# Patient Record
Sex: Male | Born: 1965 | Race: Black or African American | Hispanic: No | Marital: Married | Smoking: Current some day smoker
Health system: Southern US, Community
[De-identification: ages and names within clinical notes are randomized; demographics above are authoritative.]

## PROBLEM LIST (undated history)

## (undated) DIAGNOSIS — D649 Anemia, unspecified: Secondary | ICD-10-CM

## (undated) DIAGNOSIS — Z862 Personal history of diseases of the blood and blood-forming organs and certain disorders involving the immune mechanism: Secondary | ICD-10-CM

## (undated) DIAGNOSIS — M1990 Unspecified osteoarthritis, unspecified site: Secondary | ICD-10-CM

## (undated) HISTORY — DX: Personal history of diseases of the blood and blood-forming organs and certain disorders involving the immune mechanism: Z86.2

## (undated) HISTORY — PX: VASECTOMY: SHX75

---

## 2005-03-10 ENCOUNTER — Ambulatory Visit: Payer: Self-pay | Admitting: Family Medicine

## 2009-07-13 ENCOUNTER — Emergency Department (HOSPITAL_COMMUNITY): Admission: EM | Admit: 2009-07-13 | Discharge: 2009-07-13 | Payer: Self-pay | Admitting: Family Medicine

## 2010-05-12 LAB — POCT I-STAT, CHEM 8
BUN: 12 mg/dL (ref 6–23)
Calcium, Ion: 1.12 mmol/L (ref 1.12–1.32)
Chloride: 105 mEq/L (ref 96–112)
Creatinine, Ser: 1.6 mg/dL — ABNORMAL HIGH (ref 0.4–1.5)
Glucose, Bld: 102 mg/dL — ABNORMAL HIGH (ref 70–99)
HCT: 50 % (ref 39.0–52.0)
Hemoglobin: 17 g/dL (ref 13.0–17.0)
Potassium: 4.3 mEq/L (ref 3.5–5.1)
Sodium: 142 mEq/L (ref 135–145)
TCO2: 29 mmol/L (ref 0–100)

## 2011-09-25 ENCOUNTER — Other Ambulatory Visit: Payer: Self-pay | Admitting: Orthopedic Surgery

## 2011-09-25 ENCOUNTER — Ambulatory Visit (INDEPENDENT_AMBULATORY_CARE_PROVIDER_SITE_OTHER): Payer: BC Managed Care – PPO

## 2011-09-25 DIAGNOSIS — S6990XA Unspecified injury of unspecified wrist, hand and finger(s), initial encounter: Secondary | ICD-10-CM

## 2011-09-25 DIAGNOSIS — S6980XA Other specified injuries of unspecified wrist, hand and finger(s), initial encounter: Secondary | ICD-10-CM

## 2011-09-25 DIAGNOSIS — X58XXXA Exposure to other specified factors, initial encounter: Secondary | ICD-10-CM

## 2011-10-25 HISTORY — PX: HAND SURGERY: SHX662

## 2011-12-04 ENCOUNTER — Ambulatory Visit (INDEPENDENT_AMBULATORY_CARE_PROVIDER_SITE_OTHER): Payer: BC Managed Care – PPO

## 2011-12-04 ENCOUNTER — Other Ambulatory Visit: Payer: Self-pay | Admitting: Orthopedic Surgery

## 2011-12-04 DIAGNOSIS — R52 Pain, unspecified: Secondary | ICD-10-CM

## 2011-12-04 DIAGNOSIS — Z9889 Other specified postprocedural states: Secondary | ICD-10-CM

## 2011-12-04 DIAGNOSIS — R29898 Other symptoms and signs involving the musculoskeletal system: Secondary | ICD-10-CM

## 2011-12-04 DIAGNOSIS — M7989 Other specified soft tissue disorders: Secondary | ICD-10-CM

## 2011-12-04 DIAGNOSIS — M79609 Pain in unspecified limb: Secondary | ICD-10-CM

## 2012-02-24 HISTORY — PX: ELBOW SURGERY: SHX618

## 2012-12-13 ENCOUNTER — Ambulatory Visit (INDEPENDENT_AMBULATORY_CARE_PROVIDER_SITE_OTHER): Payer: BC Managed Care – PPO | Admitting: Family Medicine

## 2012-12-13 ENCOUNTER — Encounter: Payer: Self-pay | Admitting: Family Medicine

## 2012-12-13 VITALS — BP 124/74 | HR 91 | Ht 66.0 in | Wt 187.0 lb

## 2012-12-13 DIAGNOSIS — D649 Anemia, unspecified: Secondary | ICD-10-CM

## 2012-12-13 DIAGNOSIS — R609 Edema, unspecified: Secondary | ICD-10-CM

## 2012-12-13 DIAGNOSIS — Z862 Personal history of diseases of the blood and blood-forming organs and certain disorders involving the immune mechanism: Secondary | ICD-10-CM

## 2012-12-13 HISTORY — DX: Personal history of diseases of the blood and blood-forming organs and certain disorders involving the immune mechanism: Z86.2

## 2012-12-13 LAB — COMPLETE METABOLIC PANEL WITH GFR
ALT: 30 U/L (ref 0–53)
AST: 33 U/L (ref 0–37)
Albumin: 3.4 g/dL — ABNORMAL LOW (ref 3.5–5.2)
Alkaline Phosphatase: 28 U/L — ABNORMAL LOW (ref 39–117)
BUN: 17 mg/dL (ref 6–23)
CO2: 28 mEq/L (ref 19–32)
Calcium: 8.8 mg/dL (ref 8.4–10.5)
Chloride: 107 mEq/L (ref 96–112)
Creat: 1.29 mg/dL (ref 0.50–1.35)
GFR, Est African American: 76 mL/min
GFR, Est Non African American: 66 mL/min
Glucose, Bld: 88 mg/dL (ref 70–99)
Potassium: 4.5 mEq/L (ref 3.5–5.3)
Sodium: 139 mEq/L (ref 135–145)
Total Bilirubin: 0.5 mg/dL (ref 0.3–1.2)
Total Protein: 5 g/dL — ABNORMAL LOW (ref 6.0–8.3)

## 2012-12-13 LAB — CBC
HCT: 46.2 % (ref 39.0–52.0)
Hemoglobin: 16.1 g/dL (ref 13.0–17.0)
MCH: 32.3 pg (ref 26.0–34.0)
MCHC: 34.8 g/dL (ref 30.0–36.0)
MCV: 92.6 fL (ref 78.0–100.0)
Platelets: 272 10*3/uL (ref 150–400)
RBC: 4.99 MIL/uL (ref 4.22–5.81)
RDW: 14 % (ref 11.5–15.5)
WBC: 7 10*3/uL (ref 4.0–10.5)

## 2012-12-13 NOTE — Progress Notes (Signed)
CC: [name] is a 47 y.o. male is here for Establish Care and swelling in the lower extremeties   Subjective: HPI:  Very pleasant 47 year old here to establish care  Patient complains of bilateral lower extremity edema that has been present for the last month on a daily basis. Described as pitting mild to moderate in severity. Absent in the morning worse in the evenings improves with elevation of the legs. Is symmetric has never been asymmetric. He does admit to a high sodium diet. Symptoms began well before starting a testosterone supplement. He occasionally takes creatine but has not used it in last week. He denies abdominal bloating, shortness of breath, nor edema elsewhere in the body. He reports he has a history of anemia and didn't think that he has some swelling in the extremities when this was a problem in his adolescent age. He denies cough or shortness of breath. Symptoms do not seem to be getting worse or better since onset, onset was gradual  Review of Systems - General ROS: negative for - chills, fever, night sweats, weight gain or weight loss Ophthalmic ROS: negative for - decreased vision Psychological ROS: negative for - anxiety or depression ENT ROS: negative for - hearing change, nasal congestion, tinnitus or allergies Hematological and Lymphatic ROS: negative for - bleeding problems, bruising or swollen lymph nodes Breast ROS: negative Respiratory ROS: no cough, shortness of breath, or wheezing Cardiovascular ROS: no chest pain or dyspnea on exertion Gastrointestinal ROS: no abdominal pain, change in bowel habits, or black or bloody stools Genito-Urinary ROS: negative for - genital discharge, genital ulcers, incontinence or abnormal bleeding from genitals Musculoskeletal ROS: negative for - joint pain or muscle pain Neurological ROS: negative for - headaches or memory loss Dermatological ROS: negative for lumps, mole changes, rash and skin lesion changes  Past Medical  History  Diagnosis Date  . History of anemia 12/13/2012     Family History  Problem Relation Age of Onset  . Alcoholism    . Heart attack    . Stroke    . Cancer      lung,throat,brain     History  Substance Use Topics  . Smoking status: Current Every Day Smoker    Types: Cigarettes  . Smokeless tobacco: Not on file     Comment: e-cig  . Alcohol Use: Yes     Comment: 2 x a month     Objective: Filed Vitals:   12/13/12 1405  BP: 124/74  Pulse: 91    General: Alert and Oriented, No Acute Distress HEENT: Pupils equal, round, reactive to light. Conjunctivae clear.  Moist mucous membranes pharynx unremarkable Lungs: Clear to auscultation bilaterally, no wheezing/ronchi/rales.  Comfortable work of breathing. Good air movement. Cardiac: Regular rate and rhythm. Normal S1/S2.  No murmurs, rubs, nor gallops.   Abdomen: Soft nontender Extremities: Trace-1+ pitting edema in the hindfoot extending to just above the medial malleoli bilaterally symmetrical .  Strong peripheral pulses.  Mental Status: No depression, anxiety, nor agitation. Skin: Warm and dry.  Assessment & Plan: [name] was seen today for establish care and swelling in the lower extremeties.  Diagnoses and associated orders for this visit:  Edema - B Nat Peptide - COMPLETE METABOLIC PANEL WITH GFR  Anemia - CBC  History of anemia    Edema: Ruling out congestive heart failure, hypoalbuminemia, renal insufficiency. Given history of anemia Will check hemoglobin as well. Counseled on salt restriction which should help regardless of the above etiology.  Return  if symptoms worsen or fail to improve.

## 2012-12-14 ENCOUNTER — Telehealth: Payer: Self-pay | Admitting: Family Medicine

## 2012-12-14 LAB — BRAIN NATRIURETIC PEPTIDE: Brain Natriuretic Peptide: 23.1 pg/mL (ref 0.0–100.0)

## 2012-12-14 NOTE — Telephone Encounter (Signed)
Pt wants [place] to call him at [phone] with results.

## 2012-12-14 NOTE — Telephone Encounter (Signed)
Left message on number provided that i will call pt  With results once they have been reviewed by the doctor

## 2012-12-14 NOTE — Telephone Encounter (Signed)
Patient states that he left a message this A.M. For Sue Lush and now he is calling back because he is just really concerned about his lab results. Please call patient asap. [phone]

## 2012-12-14 NOTE — Telephone Encounter (Signed)
Pt notified of result

## 2012-12-29 ENCOUNTER — Other Ambulatory Visit: Payer: Self-pay

## 2013-02-09 ENCOUNTER — Telehealth: Payer: Self-pay | Admitting: *Deleted

## 2013-02-09 DIAGNOSIS — R609 Edema, unspecified: Secondary | ICD-10-CM

## 2013-02-09 NOTE — Telephone Encounter (Signed)
Pt left message stating that you told him to contact you if he continued to have swelling & you would possibly give him a diuretic.  Please advise

## 2013-02-10 ENCOUNTER — Ambulatory Visit (INDEPENDENT_AMBULATORY_CARE_PROVIDER_SITE_OTHER): Payer: BC Managed Care – PPO | Admitting: Family Medicine

## 2013-02-10 ENCOUNTER — Encounter: Payer: Self-pay | Admitting: Family Medicine

## 2013-02-10 VITALS — BP 122/84 | HR 80 | Wt 188.0 lb

## 2013-02-10 DIAGNOSIS — R609 Edema, unspecified: Secondary | ICD-10-CM

## 2013-02-10 MED ORDER — FUROSEMIDE 20 MG PO TABS: ORAL_TABLET | ORAL | Status: DC

## 2013-02-10 NOTE — Telephone Encounter (Signed)
Pt.notified

## 2013-02-10 NOTE — Telephone Encounter (Signed)
[facility] [address], f/u visit in the next 2-3 weeks to go over effectiveness.

## 2013-02-10 NOTE — Progress Notes (Signed)
CC: [name] is a 47 y.o. male is here for f/u edema   Subjective: HPI:  Followup lower extremity edema. Patient states there has been no overall change in severity or character of his swelling since I saw him in October. He has been trying his best to limit his salt intake to less than 2000 mg a day.  He notes that the swelling is worse as the day progresses, it is bilateral, improves when wearing tightfitting boots. He was able to resolve the swelling 100% after running 3 miles and then spending some time in a sauna, he knows of nothing else that makes the symptoms better.  He thinks that it does get worse the more sugar he ingests.  He continues to work out on a daily basis strength training and cardio. Denies abdominal bloating, shortness of breath, orthopnea, edema elsewhere.    Review Of Systems Outlined In HPI  Past Medical History  Diagnosis Date  . History of anemia 12/13/2012     Family History  Problem Relation Age of Onset  . Alcoholism    . Heart attack    . Stroke    . Cancer      lung,throat,brain     History  Substance Use Topics  . Smoking status: Current Every Day Smoker    Types: Cigarettes  . Smokeless tobacco: Not on file     Comment: e-cig  . Alcohol Use: Yes     Comment: 2 x a month     Objective: Filed Vitals:   02/10/13 0956  BP: 122/84  Pulse: 80    General: Alert and Oriented, No Acute Distress HEENT: Pupils equal, round, reactive to light. Conjunctivae clear.  Moist mucous membranes pharynx unremarkable Lungs: Clear to auscultation bilaterally, no wheezing/ronchi/rales.  Comfortable work of breathing. Good air movement. Cardiac: Regular rate and rhythm. Normal S1/S2.  No murmurs, rubs, nor gallops.   Extremities: 1+ pitting edema from the malleoli proximally to the mid shins bilaterally symmetric.  Strong peripheral pulses.  Mental Status: No depression, anxiety, nor agitation. Skin: Warm and dry.  Assessment & Plan: [name] was seen  today for f/u edema.  Diagnoses and associated orders for this visit:  Edema - Microalbumin / creatinine urine ratio    Edema: Uncontrolled, Suspect venous insufficiency, his EGFR was slightly low for his age when we checked this last however he has much more than average skeletal muscle from his workout routine which I think may be contributing to his creatinine, which was high normal.  For both of our comfort we will obtain urine micro albumin creatinine ratio to see if kidney insufficiency is contributing to his edema. For now start Lasix as needed pending the above results  Return if symptoms worsen or fail to improve.

## 2013-02-11 LAB — MICROALBUMIN / CREATININE URINE RATIO
Creatinine, Urine: 128.7 mg/dL
Microalb Creat Ratio: 3.9 mg/g (ref 0.0–30.0)
Microalb, Ur: 0.5 mg/dL (ref 0.00–1.89)

## 2013-04-06 ENCOUNTER — Other Ambulatory Visit: Payer: Self-pay | Admitting: Family Medicine

## 2013-04-06 ENCOUNTER — Other Ambulatory Visit: Payer: Self-pay | Admitting: *Deleted

## 2013-04-06 MED ORDER — FUROSEMIDE 20 MG PO TABS: ORAL_TABLET | ORAL | Status: DC

## 2013-06-01 ENCOUNTER — Other Ambulatory Visit: Payer: Self-pay | Admitting: Family Medicine

## 2013-08-12 ENCOUNTER — Other Ambulatory Visit: Payer: Self-pay | Admitting: Family Medicine

## 2013-10-25 ENCOUNTER — Other Ambulatory Visit: Payer: Self-pay | Admitting: Family Medicine

## 2015-03-14 DIAGNOSIS — F411 Generalized anxiety disorder: Secondary | ICD-10-CM | POA: Diagnosis not present

## 2015-03-14 DIAGNOSIS — F331 Major depressive disorder, recurrent, moderate: Secondary | ICD-10-CM | POA: Diagnosis not present

## 2015-06-14 DIAGNOSIS — M25561 Pain in right knee: Secondary | ICD-10-CM | POA: Diagnosis not present

## 2015-06-14 DIAGNOSIS — Z87828 Personal history of other (healed) physical injury and trauma: Secondary | ICD-10-CM | POA: Diagnosis not present

## 2015-06-18 DIAGNOSIS — S46811A Strain of other muscles, fascia and tendons at shoulder and upper arm level, right arm, initial encounter: Secondary | ICD-10-CM | POA: Diagnosis not present

## 2015-06-20 DIAGNOSIS — S46811D Strain of other muscles, fascia and tendons at shoulder and upper arm level, right arm, subsequent encounter: Secondary | ICD-10-CM | POA: Diagnosis not present

## 2015-07-09 NOTE — H&P (Signed)
  [name] is an 50 y.o. male.    Chief Complaint: right shoulder pain  HPI: Pt is a 50 y.o. male complaining of right shoulder pain since recent injury. Pain had continually increased since the beginning. X-rays in the clinic proximal biceps rupture right shoulder. Pt has tried various conservative treatments which have failed to alleviate their symptoms. Various options are discussed with the patient. Risks, benefits and expectations were discussed with the patient. Patient understand the risks, benefits and expectations and wishes to proceed with surgery.   PCP:  Steve BoomHommel, Sean, DO  D/C Plans: Home  PMH: Past Medical History  Diagnosis Date  . History of anemia 12/13/2012    PSH: Past Surgical History  Procedure Laterality Date  . Hand surgery  10/2011    Social History:  reports that he has been smoking Cigarettes.  He does not have any smokeless tobacco history on file. He reports that he drinks alcohol. He reports that he does not use illicit drugs.  Allergies:  No Known Allergies  Medications: No current facility-administered medications for this encounter.   Current Outpatient Prescriptions  Medication Sig Dispense Refill  . AMBULATORY NON FORMULARY MEDICATION Xabol Testosterone booster    . furosemide (LASIX) 20 MG tablet TAKE 1 TABLET DAILY AS NEEDED FOR LOWER EXTREMITY SWELLING (PATIENT WILL NEED TO MAKE AN APPOINTMENT BEFORE MORE REFILLS) 30 tablet 0  . Multiple Vitamin (MULTIVITAMIN) tablet Take 1 tablet by mouth daily.      No results found for this or any previous visit (from the past 48 hour(s)). No results found.  ROS: Pain with rom of the right upper extremity  Physical Exam:  Alert and oriented 50 y.o. male in no acute distress Cranial nerves 2-12 intact Cervical spine: full rom with no tenderness, nv intact distally Chest: active breath sounds bilaterally, no wheeze rhonchi or rales Heart: regular rate and rhythm, no murmur Abd: non tender non  distended with active bowel sounds Hip is stable with rom  Right shoulder with obvious proximal biceps rupture with deformity nv intact distally Strength of ER and IR 4.5/5 as compared to left due to guarding No rashes or edema  Assessment/Plan Assessment: right shoulder proximal biceps rupture  Plan: Patient will undergo a right shoulder scope with open subpectoral biceps tenodesis by Dr[facility] Ranell [facility]ris [facility]. Risks benefits and expectations were discussed with the patient. Patient understand risks, benefits and expectations and wishes to proceed.

## 2015-07-09 NOTE — Pre-Procedure Instructions (Addendum)
[name]  07/09/2015      [id number] [place] [place] - [address] [place] [address] Phone: [phone] Fax: [phone]  [facility] [place] - [address] [place] [id number] Phone: [phone] Fax: [phone]  [facility] [place] [address] [place] [id number] Phone: [phone] Fax: [phone]    Your procedure is scheduled on Friday, May 19.  [facility] at 10:30 A.M.               Your surgery or procedure is scheduled for 12:45 PM   Call this number if you have problems the morning of surgery:[phone]                 For any other questions, please call [phone], Monday - Friday 8 AM - 4 PM.   Remember:  Do not eat food or drink liquids after midnight Thursday, May 18.  Take these medicines the morning of surgery with A SIP OF WATER : None.                 Stop taking Multi Viamins, do not take Aspirin, Aspirin Products, Ibuprofen (Advil), Naproxen (Aleve), or Herbal Medications.   Do not wear jewelry, make-up or nail polish.  Do not wear lotions, powders, or perfumes.     Men may shave face and neck.  Do not bring valuables to the hospital.  [facility] is not responsible for any belongings or valuables.  Contacts, dentures or bridgework may not be worn into surgery.  Leave your suitcase in the car.  After surgery it may be brought to your room.  For patients admitted to the hospital, discharge time will be determined by your treatment team.  Patients discharged the day of surgery will not be allowed to drive home.   Name and phone number of your driver:   -  Special instructions:   Special Instructions: [employer] - Preparing for Surgery  Before surgery, you can play an important role.  Because skin is not sterile, your skin needs to be as  free of germs as possible.  You can reduce the number of germs on you skin by washing with CHG (chlorahexidine gluconate) soap before surgery.  CHG is an antiseptic cleaner which kills germs and bonds with the skin to continue killing germs even after washing.  Please DO NOT use if you have an allergy to CHG or antibacterial soaps.  If your skin becomes reddened/irritated stop using the CHG and inform your nurse when you arrive at Short Stay.  Do not shave (including legs and underarms) for at least 48 hours prior to the first CHG shower.  You may shave your face.  Please follow these instructions carefully:   1.  Shower with CHG Soap the night before surgery and the morning of Surgery.  2.  If you choose to wash your hair, wash your hair first as usual with your normal shampoo.  3.  After you shampoo, rinse your hair and body thoroughly to remove the Shampoo.  4.  Use CHG as you would any other liquid soap.  You can apply chg directly  to the skin and wash gently with scrungie or a clean washcloth.  5.  Apply the CHG Soap to your body ONLY FROM THE NECK DOWN.  Do not use on open wounds or open sores.  Avoid contact with your eyes ears, mouth and genitals (private parts).  Wash genitals (private parts)       with your normal soap.  6.  Wash thoroughly, paying special attention to the area where your surgery will be performed.  7.  Thoroughly rinse your body with warm water from the neck down.  8.  DO NOT shower/wash with your normal soap after using and rinsing off the CHG Soap.  9.  Pat yourself dry with a clean towel.            10.  Wear clean pajamas.            11.  Place clean sheets on your bed the night of your first shower and do not sleep with pets.  Day of Surgery  Do not apply any lotions/deodorants the morning of surgery.  Please wear clean clothes to the hospital/surgery center.  Please read over the following fact sheets that you were given. Pain Booklet, Coughing and Deep  Breathing and Surgical Site Infection Prevention

## 2015-07-10 ENCOUNTER — Encounter (HOSPITAL_COMMUNITY)
Admission: RE | Admit: 2015-07-10 | Discharge: 2015-07-10 | Disposition: A | Discharge status: Home / Self Care | Payer: 59 | Source: Ambulatory Visit | Attending: Orthopedic Surgery | Admitting: Orthopedic Surgery

## 2015-07-10 ENCOUNTER — Encounter (HOSPITAL_COMMUNITY): Payer: Self-pay

## 2015-07-10 DIAGNOSIS — X58XXXA Exposure to other specified factors, initial encounter: Secondary | ICD-10-CM | POA: Diagnosis not present

## 2015-07-10 DIAGNOSIS — Z87891 Personal history of nicotine dependence: Secondary | ICD-10-CM | POA: Diagnosis not present

## 2015-07-10 DIAGNOSIS — F17210 Nicotine dependence, cigarettes, uncomplicated: Secondary | ICD-10-CM | POA: Diagnosis not present

## 2015-07-10 DIAGNOSIS — S43431A Superior glenoid labrum lesion of right shoulder, initial encounter: Secondary | ICD-10-CM | POA: Diagnosis not present

## 2015-07-10 DIAGNOSIS — S46211A Strain of muscle, fascia and tendon of other parts of biceps, right arm, initial encounter: Secondary | ICD-10-CM | POA: Diagnosis present

## 2015-07-10 HISTORY — DX: Anemia, unspecified: D64.9

## 2015-07-10 HISTORY — DX: Unspecified osteoarthritis, unspecified site: M19.90

## 2015-07-10 LAB — CBC
HCT: 48.7 % (ref 39.0–52.0)
Hemoglobin: 16.2 g/dL (ref 13.0–17.0)
MCH: 29.2 pg (ref 26.0–34.0)
MCHC: 33.3 g/dL (ref 30.0–36.0)
MCV: 87.7 fL (ref 78.0–100.0)
Platelets: 168 10*3/uL (ref 150–400)
RBC: 5.55 MIL/uL (ref 4.22–5.81)
RDW: 14.5 % (ref 11.5–15.5)
WBC: 5.4 10*3/uL (ref 4.0–10.5)

## 2015-07-11 MED ORDER — DEXTROSE 5 % IV SOLN
3.0000 g | INTRAVENOUS | Status: AC
  Administered 2015-07-12: 3 g via INTRAVENOUS
  Filled 2015-07-11: qty 3000

## 2015-07-12 ENCOUNTER — Ambulatory Visit (HOSPITAL_COMMUNITY): Payer: 59 | Admitting: Anesthesiology

## 2015-07-12 ENCOUNTER — Encounter (HOSPITAL_COMMUNITY): Payer: Self-pay | Admitting: *Deleted

## 2015-07-12 ENCOUNTER — Ambulatory Visit (HOSPITAL_COMMUNITY)
Admission: RE | Admit: 2015-07-12 | Discharge: 2015-07-12 | Disposition: A | Discharge status: Home / Self Care | Payer: 59 | Source: Ambulatory Visit | Attending: Orthopedic Surgery | Admitting: Orthopedic Surgery

## 2015-07-12 ENCOUNTER — Encounter (HOSPITAL_COMMUNITY)
Admission: RE | Disposition: A | Discharge status: Home / Self Care | Payer: Self-pay | Source: Ambulatory Visit | Attending: Orthopedic Surgery

## 2015-07-12 DIAGNOSIS — G8918 Other acute postprocedural pain: Secondary | ICD-10-CM | POA: Diagnosis not present

## 2015-07-12 DIAGNOSIS — Z87891 Personal history of nicotine dependence: Secondary | ICD-10-CM | POA: Diagnosis not present

## 2015-07-12 DIAGNOSIS — M66821 Spontaneous rupture of other tendons, right upper arm: Secondary | ICD-10-CM | POA: Diagnosis not present

## 2015-07-12 DIAGNOSIS — S46111D Strain of muscle, fascia and tendon of long head of biceps, right arm, subsequent encounter: Secondary | ICD-10-CM | POA: Diagnosis not present

## 2015-07-12 DIAGNOSIS — F17210 Nicotine dependence, cigarettes, uncomplicated: Secondary | ICD-10-CM | POA: Insufficient documentation

## 2015-07-12 DIAGNOSIS — S43431A Superior glenoid labrum lesion of right shoulder, initial encounter: Secondary | ICD-10-CM | POA: Insufficient documentation

## 2015-07-12 DIAGNOSIS — S46211A Strain of muscle, fascia and tendon of other parts of biceps, right arm, initial encounter: Secondary | ICD-10-CM | POA: Diagnosis not present

## 2015-07-12 DIAGNOSIS — X58XXXA Exposure to other specified factors, initial encounter: Secondary | ICD-10-CM | POA: Insufficient documentation

## 2015-07-12 HISTORY — PX: BICEPT TENODESIS: SHX5116

## 2015-07-12 HISTORY — PX: SHOULDER ARTHROSCOPY WITH SUBACROMIAL DECOMPRESSION: SHX5684

## 2015-07-12 SURGERY — SHOULDER ARTHROSCOPY WITH SUBACROMIAL DECOMPRESSION
Anesthesia: Regional | Site: Shoulder | Laterality: Right

## 2015-07-12 MED ORDER — BUPIVACAINE-EPINEPHRINE 0.25% -1:200000 IJ SOLN
INTRAMUSCULAR | Status: DC | PRN
  Administered 2015-07-12: 5 mL
  Administered 2015-07-12: 2 mL

## 2015-07-12 MED ORDER — FENTANYL CITRATE (PF) 100 MCG/2ML IJ SOLN
50.0000 ug | Freq: Once | INTRAMUSCULAR | Status: AC
  Administered 2015-07-12: 50 ug via INTRAVENOUS

## 2015-07-12 MED ORDER — BUPIVACAINE-EPINEPHRINE (PF) 0.25% -1:200000 IJ SOLN
INTRAMUSCULAR | Status: AC
Start: 2015-07-12 — End: 2015-07-12
  Filled 2015-07-12: qty 30

## 2015-07-12 MED ORDER — FENTANYL CITRATE (PF) 100 MCG/2ML IJ SOLN
INTRAMUSCULAR | Status: DC | PRN
  Administered 2015-07-12: 100 ug via INTRAVENOUS

## 2015-07-12 MED ORDER — NEOSTIGMINE METHYLSULFATE 10 MG/10ML IV SOLN
INTRAVENOUS | Status: DC | PRN
  Administered 2015-07-12: 3 mg via INTRAVENOUS

## 2015-07-12 MED ORDER — CHLORHEXIDINE GLUCONATE 4 % EX LIQD: 60.0000 mL | Freq: Once | CUTANEOUS | Status: DC

## 2015-07-12 MED ORDER — 0.9 % SODIUM CHLORIDE (POUR BTL) OPTIME
TOPICAL | Status: DC | PRN
  Administered 2015-07-12: 1000 mL

## 2015-07-12 MED ORDER — ROCURONIUM BROMIDE 100 MG/10ML IV SOLN
INTRAVENOUS | Status: DC | PRN
Start: 2015-07-12 — End: 2015-07-12
  Administered 2015-07-12: 50 mg via INTRAVENOUS

## 2015-07-12 MED ORDER — ONDANSETRON HCL 4 MG/2ML IJ SOLN
INTRAMUSCULAR | Status: DC | PRN
  Administered 2015-07-12: 4 mg via INTRAVENOUS

## 2015-07-12 MED ORDER — LIDOCAINE 2% (20 MG/ML) 5 ML SYRINGE
INTRAMUSCULAR | Status: AC
  Filled 2015-07-12: qty 10

## 2015-07-12 MED ORDER — METHOCARBAMOL 500 MG PO TABS: 500.0000 mg | ORAL_TABLET | Freq: Three times a day (TID) | ORAL | Status: DC | PRN

## 2015-07-12 MED ORDER — BUPIVACAINE-EPINEPHRINE (PF) 0.5% -1:200000 IJ SOLN
INTRAMUSCULAR | Status: DC | PRN
  Administered 2015-07-12: 20 mL via PERINEURAL

## 2015-07-12 MED ORDER — EPHEDRINE 5 MG/ML INJ
INTRAVENOUS | Status: AC
  Filled 2015-07-12: qty 10

## 2015-07-12 MED ORDER — LIDOCAINE HCL (CARDIAC) 20 MG/ML IV SOLN
INTRAVENOUS | Status: DC | PRN
  Administered 2015-07-12: 100 mg via INTRAVENOUS

## 2015-07-12 MED ORDER — MIDAZOLAM HCL 2 MG/2ML IJ SOLN
2.0000 mg | Freq: Once | INTRAMUSCULAR | Status: AC
Start: 2015-07-12 — End: 2015-07-12
  Administered 2015-07-12: 2 mg via INTRAVENOUS

## 2015-07-12 MED ORDER — EPHEDRINE SULFATE 50 MG/ML IJ SOLN
INTRAMUSCULAR | Status: DC | PRN
  Administered 2015-07-12: 7.5 mg via INTRAVENOUS

## 2015-07-12 MED ORDER — NEOSTIGMINE METHYLSULFATE 5 MG/5ML IV SOSY
PREFILLED_SYRINGE | INTRAVENOUS | Status: AC
  Filled 2015-07-12: qty 5

## 2015-07-12 MED ORDER — ONDANSETRON HCL 4 MG/2ML IJ SOLN
INTRAMUSCULAR | Status: AC
  Filled 2015-07-12: qty 2

## 2015-07-12 MED ORDER — GLYCOPYRROLATE 0.2 MG/ML IV SOSY
PREFILLED_SYRINGE | INTRAVENOUS | Status: AC
  Filled 2015-07-12: qty 3

## 2015-07-12 MED ORDER — PROPOFOL 10 MG/ML IV BOLUS
INTRAVENOUS | Status: DC | PRN
  Administered 2015-07-12: 200 mg via INTRAVENOUS

## 2015-07-12 MED ORDER — KETOROLAC TROMETHAMINE 30 MG/ML IJ SOLN
INTRAMUSCULAR | Status: AC
  Filled 2015-07-12: qty 1

## 2015-07-12 MED ORDER — FENTANYL CITRATE (PF) 100 MCG/2ML IJ SOLN: 25.0000 ug | INTRAMUSCULAR | Status: DC | PRN

## 2015-07-12 MED ORDER — FENTANYL CITRATE (PF) 250 MCG/5ML IJ SOLN
INTRAMUSCULAR | Status: AC
  Filled 2015-07-12: qty 5

## 2015-07-12 MED ORDER — PROPOFOL 10 MG/ML IV BOLUS
INTRAVENOUS | Status: AC
  Filled 2015-07-12: qty 20

## 2015-07-12 MED ORDER — FENTANYL CITRATE (PF) 100 MCG/2ML IJ SOLN
INTRAMUSCULAR | Status: AC
  Administered 2015-07-12: 50 ug via INTRAVENOUS
  Filled 2015-07-12: qty 2

## 2015-07-12 MED ORDER — KETOROLAC TROMETHAMINE 30 MG/ML IJ SOLN
30.0000 mg | Freq: Once | INTRAMUSCULAR | Status: AC
  Administered 2015-07-12: 30 mg via INTRAVENOUS
  Filled 2015-07-12: qty 1

## 2015-07-12 MED ORDER — SODIUM CHLORIDE 0.9 % IR SOLN
Status: DC | PRN
Start: 2015-07-12 — End: 2015-07-12
  Administered 2015-07-12 (×2): 3000 mL

## 2015-07-12 MED ORDER — ROCURONIUM BROMIDE 50 MG/5ML IV SOLN
INTRAVENOUS | Status: AC
  Filled 2015-07-12: qty 1

## 2015-07-12 MED ORDER — LACTATED RINGERS IV SOLN
Freq: Once | INTRAVENOUS | Status: AC
  Administered 2015-07-12: 12:00:00 via INTRAVENOUS

## 2015-07-12 MED ORDER — PROPOFOL 10 MG/ML IV BOLUS
INTRAVENOUS | Status: AC
  Filled 2015-07-12: qty 40

## 2015-07-12 MED ORDER — PHENYLEPHRINE HCL 10 MG/ML IJ SOLN
10.0000 mg | INTRAMUSCULAR | Status: DC | PRN
  Administered 2015-07-12: 10 ug/min via INTRAVENOUS

## 2015-07-12 MED ORDER — MIDAZOLAM HCL 2 MG/2ML IJ SOLN
INTRAMUSCULAR | Status: AC
  Administered 2015-07-12: 2 mg via INTRAVENOUS
  Filled 2015-07-12: qty 2

## 2015-07-12 MED ORDER — GLYCOPYRROLATE 0.2 MG/ML IJ SOLN
INTRAMUSCULAR | Status: DC | PRN
  Administered 2015-07-12: 0.6 mg via INTRAVENOUS

## 2015-07-12 MED ORDER — OXYCODONE-ACETAMINOPHEN 5-325 MG PO TABS: 1.0000 | ORAL_TABLET | ORAL | Status: DC | PRN

## 2015-07-12 MED FILL — OXYCODONE/APAP 5/325 MG TAB: 5-325 | 5 days supply | Qty: 60 | Fill #0

## 2015-07-12 SURGICAL SUPPLY — 73 items
ANCHOR ALL-SUT FLEX 1.3 Y-KNOT (Anchor) ×6 IMPLANT
BIT DRILL YKNOT HARD BONE 1.3 (BIT) ×3 IMPLANT
BLADE LONG MED 31MMX9MM (MISCELLANEOUS)
BLADE LONG MED 31X9 (MISCELLANEOUS) IMPLANT
BLADE SURG 11 STRL SS (BLADE) ×3 IMPLANT
BUR OVAL 4.0 (BURR) ×3 IMPLANT
CLOSURE WOUND 1/2 X4 (GAUZE/BANDAGES/DRESSINGS) ×1
COVER SURGICAL LIGHT HANDLE (MISCELLANEOUS) ×3 IMPLANT
DRAPE INCISE IOBAN 66X45 STRL (DRAPES) ×3 IMPLANT
DRAPE ORTHO SPLIT 77X108 STRL (DRAPES) ×2
DRAPE STERI 35X30 U-POUCH (DRAPES) ×3 IMPLANT
DRAPE SURG ORHT 6 SPLT 77X108 (DRAPES) ×1 IMPLANT
DRAPE U-SHAPE 47X51 STRL (DRAPES) ×3 IMPLANT
DRILL BIT 5/64 (BIT) ×3 IMPLANT
DRSG ADAPTIC 3X8 NADH LF (GAUZE/BANDAGES/DRESSINGS) ×3 IMPLANT
DRSG EMULSION OIL 3X3 NADH (GAUZE/BANDAGES/DRESSINGS) ×6 IMPLANT
DRSG PAD ABDOMINAL 8X10 ST (GAUZE/BANDAGES/DRESSINGS) ×6 IMPLANT
DURAPREP 26ML APPLICATOR (WOUND CARE) ×3 IMPLANT
ELECT NEEDLE TIP 2.8 STRL (NEEDLE) ×3 IMPLANT
ELECT REM PT RETURN 9FT ADLT (ELECTROSURGICAL)
ELECTRODE REM PT RTRN 9FT ADLT (ELECTROSURGICAL) IMPLANT
GAUZE SPONGE 4X4 12PLY STRL (GAUZE/BANDAGES/DRESSINGS) ×3 IMPLANT
GLOVE BIOGEL PI ORTHO PRO 7.5 (GLOVE) ×2
GLOVE BIOGEL PI ORTHO PRO SZ8 (GLOVE) ×2
GLOVE ORTHO TXT STRL SZ7.5 (GLOVE) ×3 IMPLANT
GLOVE PI ORTHO PRO STRL 7.5 (GLOVE) ×1 IMPLANT
GLOVE PI ORTHO PRO STRL SZ8 (GLOVE) ×1 IMPLANT
GLOVE SURG ORTHO 8.5 STRL (GLOVE) ×3 IMPLANT
GOWN STRL REUS W/ TWL LRG LVL3 (GOWN DISPOSABLE) ×2 IMPLANT
GOWN STRL REUS W/ TWL XL LVL3 (GOWN DISPOSABLE) ×2 IMPLANT
GOWN STRL REUS W/TWL LRG LVL3 (GOWN DISPOSABLE) ×4
GOWN STRL REUS W/TWL XL LVL3 (GOWN DISPOSABLE) ×4
KIT BASIN OR (CUSTOM PROCEDURE TRAY) ×3 IMPLANT
KIT JUGGERKNOT DISP 2.9MM (KITS) IMPLANT
KIT ROOM TURNOVER OR (KITS) ×3 IMPLANT
MANIFOLD NEPTUNE II (INSTRUMENTS) ×3 IMPLANT
NDL SUT 6 .5 CRC .975X.05 MAYO (NEEDLE) IMPLANT
NEEDLE HYPO 25GX1X1/2 BEV (NEEDLE) ×3 IMPLANT
NEEDLE MAYO TAPER (NEEDLE)
NEEDLE SPNL 18GX3.5 QUINCKE PK (NEEDLE) ×3 IMPLANT
NS IRRIG 1000ML POUR BTL (IV SOLUTION) ×3 IMPLANT
PACK SHOULDER (CUSTOM PROCEDURE TRAY) ×3 IMPLANT
PAD ARMBOARD 7.5X6 YLW CONV (MISCELLANEOUS) ×6 IMPLANT
RESECTOR FULL RADIUS 4.2MM (BLADE) ×3 IMPLANT
SET ARTHROSCOPY TUBING (MISCELLANEOUS) ×2
SET ARTHROSCOPY TUBING LN (MISCELLANEOUS) ×1 IMPLANT
SLING ARM FOAM STRAP LRG (SOFTGOODS) ×3 IMPLANT
SLING ARM LRG ADULT FOAM STRAP (SOFTGOODS) ×3 IMPLANT
SLING ARM MED ADULT FOAM STRAP (SOFTGOODS) IMPLANT
SPONGE GAUZE 4X4 12PLY STER LF (GAUZE/BANDAGES/DRESSINGS) ×3 IMPLANT
SPONGE LAP 4X18 X RAY DECT (DISPOSABLE) ×3 IMPLANT
STRIP CLOSURE SKIN 1/2X4 (GAUZE/BANDAGES/DRESSINGS) ×2 IMPLANT
SUCTION FRAZIER HANDLE 10FR (MISCELLANEOUS) ×2
SUCTION TUBE FRAZIER 10FR DISP (MISCELLANEOUS) ×1 IMPLANT
SUT BONE WAX W31G (SUTURE) IMPLANT
SUT FIBERWIRE #2 38 T-5 BLUE (SUTURE)
SUT HI-FI 2 STRAND C-2 40 (SUTURE) ×3 IMPLANT
SUT MNCRL AB 4-0 PS2 18 (SUTURE) ×3 IMPLANT
SUT VIC AB 0 CT1 27 (SUTURE)
SUT VIC AB 0 CT1 27XBRD ANBCTR (SUTURE) IMPLANT
SUT VIC AB 0 CT2 27 (SUTURE) IMPLANT
SUT VIC AB 2-0 CT1 27 (SUTURE)
SUT VIC AB 2-0 CT1 TAPERPNT 27 (SUTURE) IMPLANT
SUT VICRYL 0 CT 1 36IN (SUTURE) ×9 IMPLANT
SUTURE FIBERWR #2 38 T-5 BLUE (SUTURE) IMPLANT
SYR CONTROL 10ML LL (SYRINGE) ×3 IMPLANT
TAPE CLOTH SURG 6X10 WHT LF (GAUZE/BANDAGES/DRESSINGS) ×3 IMPLANT
TOWEL OR 17X24 6PK STRL BLUE (TOWEL DISPOSABLE) ×3 IMPLANT
TOWEL OR 17X26 10 PK STRL BLUE (TOWEL DISPOSABLE) ×3 IMPLANT
TUBE CONNECTING 12'X1/4 (SUCTIONS) ×1
TUBE CONNECTING 12X1/4 (SUCTIONS) ×2 IMPLANT
WAND HAND CNTRL MULTIVAC 90 (MISCELLANEOUS) ×3 IMPLANT
WATER STERILE IRR 1000ML POUR (IV SOLUTION) ×3 IMPLANT

## 2015-07-12 NOTE — Discharge Instructions (Signed)
Ice to the shoulder at all times  Rest the arm in the sling while up, but ok to lean to the right and slide the sling off then hug a pillow  Do exercises every hour while awake for 5 minutes  Lap slides  Gentle door hinge exercises - hug yourself then hitch hike thumb out  Pendulum exercises  Follow up in two weeks in the office  Start pain meds before the block wears off

## 2015-07-12 NOTE — Interval H&P Note (Signed)
History and Physical Interval Note:  07/12/2015 1:13 PM  [name]  has presented today for surgery, with the diagnosis of RIGHT SHOULDER LABRAL TEAR, BICEPS RUPTURE  The various methods of treatment have been discussed with the patient and family. After consideration of risks, benefits and other options for treatment, the patient has consented to  Procedure(s) with comments: RIGHT SHOULDER ARTHROSCOPY WITH LABRAL DEBRIDEMENT LIMITED SUBACROMIAL DECOMPRESSION (Right) - ANESTHESIA:  CHOICE WITH INTERSCALENE BLOCK RIGHT SUBPECTORAL TENODESIS (Right) as a surgical intervention .  The patient's history has been reviewed, patient examined, no change in status, stable for surgery.  I have reviewed the patient's chart and labs.  Questions were answered to the patient's satisfaction.     [name],STEVEN R

## 2015-07-12 NOTE — Brief Op Note (Signed)
07/12/2015  3:23 PM  PATIENT:  [name]  50 y.o. male  PRE-OPERATIVE DIAGNOSIS:  RIGHT SHOULDER LABRAL TEAR, BICEPS RUPTURE  POST-OPERATIVE DIAGNOSIS:  RIGHT SHOULDER LABRAL TEAR, BICEPS RUPTURE, PARALABRAL CYST  PROCEDURE:  Procedure(s) with comments: RIGHT SHOULDER ARTHROSCOPY WITH LABRAL DEBRIDEMENT LIMITED SUBACROMIAL DECOMPRESSION (Right) - ANESTHESIA:  CHOICE WITH INTERSCALENE BLOCK RIGHT SUBPECTORAL TENODESIS (Right) PARALABRAL CYST DECOMPRESSION  SURGEON:  Surgeon(s) and Role:    * Beverely LowSteve Sahana Boyland, MD - Primary  PHYSICIAN ASSISTANT:   ASSISTANTS: Leilani AbleSteve Chabon PA-C   ANESTHESIA:   regional and general  EBL:     BLOOD ADMINISTERED:none  DRAINS: none   LOCAL MEDICATIONS USED:  MARCAINE     SPECIMEN:  No Specimen  DISPOSITION OF SPECIMEN:  N/A  COUNTS:  YES  TOURNIQUET:  * No tourniquets in log *  DICTATION: .Other Dictation: Dictation Number [id number]  PLAN OF CARE: Discharge to home after PACU  PATIENT DISPOSITION:  PACU - hemodynamically stable.   Delay start of Pharmacological VTE agent (>24hrs) due to surgical blood loss or risk of bleeding: not applicable

## 2015-07-12 NOTE — Transfer of Care (Signed)
Immediate Anesthesia Transfer of Care Note  Patient: [name]  Procedure(s) Performed: Procedure(s) with comments: RIGHT SHOULDER ARTHROSCOPY WITH LABRAL DEBRIDEMENT LIMITED SUBACROMIAL DECOMPRESSION (Right) - ANESTHESIA:  CHOICE WITH INTERSCALENE BLOCK RIGHT SUBPECTORAL TENODESIS (Right)  Patient Location: PACU  Anesthesia Type:GA combined with regional for post-op pain  Level of Consciousness: awake, alert  and oriented  Airway & Oxygen Therapy: Patient Spontanous Breathing and Patient connected to nasal cannula oxygen  Post-op Assessment: Report given to RN, Post -op Vital signs reviewed and stable and Patient moving all extremities  Post vital signs: Reviewed and stable  Last Vitals:  Filed Vitals:   07/12/15 1230 07/12/15 1235  BP: 140/70 145/77  Pulse: 84 82  Temp:    Resp: 21 23    Last Pain: There were no vitals filed for this visit.       Complications: No apparent anesthesia complications

## 2015-07-12 NOTE — Anesthesia Procedure Notes (Addendum)
Anesthesia Regional Block:  Interscalene brachial plexus block  Pre-Anesthetic Checklist: ,, timeout performed, Correct Patient, Correct Site, Correct Laterality, Correct Procedure, Correct Position, site marked, Risks and benefits discussed,  Surgical consent,  Pre-op evaluation,  At surgeon's request and post-op pain management  Laterality: Right  Prep: chloraprep       Needles:  Injection technique: Single-shot  Needle Type: Echogenic Stimulator Needle     Needle Length: 9cm 9 cm Needle Gauge: 22 and 22 G    Additional Needles:  Procedures: ultrasound guided (picture in chart) Interscalene brachial plexus block Narrative:  Injection made incrementally with aspirations every 5 mL.  Performed by: Personally  Anesthesiologist: Cecile HearingURK, STEPHEN EDWARD  Additional Notes: Functioning IV was confirmed and monitors were applied.  A 90mm 22ga Arrow echogenic stimulator needle was used. Sterile prep and drape, hand hygiene and sterile gloves were used.  Negative aspiration and negative test dose prior to incremental administration of local anesthetic. The patient tolerated the procedure well.  Ultrasound guidance: relevent anatomy identified, needle position confirmed, local anesthetic spread visualized around nerve(s), vascular puncture avoided.  Image printed for medical record.    Procedure Name: Intubation Date/Time: 07/12/2015 1:46 PM Performed by: Sharlene DoryWALKER, Darcell Sabino E Pre-anesthesia Checklist: Patient identified, Emergency Drugs available, Suction available, Patient being monitored and Timeout performed Patient Re-evaluated:Patient Re-evaluated prior to inductionOxygen Delivery Method: Circle system utilized Preoxygenation: Pre-oxygenation with 100% oxygen Intubation Type: IV induction Ventilation: Mask ventilation without difficulty Laryngoscope Size: Mac and 4 Grade View: Grade II Tube type: Oral Tube size: 7.5 mm Number of attempts: 1 Airway Equipment and Method:  Stylet Placement Confirmation: ETT inserted through vocal cords under direct vision,  positive ETCO2 and breath sounds checked- equal and bilateral Secured at: 22 cm Tube secured with: Tape Dental Injury: Teeth and Oropharynx as per pre-operative assessment

## 2015-07-12 NOTE — Anesthesia Preprocedure Evaluation (Addendum)
Anesthesia Evaluation  Patient identified by MRN, date of birth, ID band Patient awake    Reviewed: Allergy & Precautions, NPO status , Patient's Chart, lab work & pertinent test results  Airway Mallampati: II  TM Distance: >3 FB Neck ROM: Full    Dental  (+) Teeth Intact, Dental Advisory Given, Chipped,    Pulmonary former smoker,    Pulmonary exam normal breath sounds clear to auscultation       Cardiovascular negative cardio ROS Normal cardiovascular exam Rhythm:Regular Rate:Normal     Neuro/Psych negative neurological ROS  negative psych ROS   GI/Hepatic negative GI ROS, Neg liver ROS,   Endo/Other  negative endocrine ROS  Renal/GU negative Renal ROS     Musculoskeletal  (+) Arthritis , Osteoarthritis,    Abdominal   Peds  Hematology negative hematology ROS (+)   Anesthesia Other Findings Day of surgery medications reviewed with the patient.  Reproductive/Obstetrics                          Anesthesia Physical Anesthesia Plan  ASA: I  Anesthesia Plan: General and Regional   Post-op Pain Management:  Regional for Post-op pain   Induction: Intravenous  Airway Management Planned: Oral ETT  Additional Equipment:   Intra-op Plan:   Post-operative Plan: Extubation in OR  Informed Consent: I have reviewed the patients History and Physical, chart, labs and discussed the procedure including the risks, benefits and alternatives for the proposed anesthesia with the patient or authorized representative who has indicated his/her understanding and acceptance.   Dental advisory given  Plan Discussed with: CRNA  Anesthesia Plan Comments: (Risks/benefits of general anesthesia discussed with patient including risk of damage to teeth, lips, gum, and tongue, nausea/vomiting, allergic reactions to medications, and the possibility of heart attack, stroke and death.  All patient questions  answered.  Patient wishes to proceed.  Discussed risks and benefits of interscalene block including failure, bleeding, infection, nerve damage, weakness, shortness of breath, pneumothorax. Questions answered. Patient consents to block. )       Anesthesia Quick Evaluation

## 2015-07-12 NOTE — Anesthesia Postprocedure Evaluation (Signed)
Anesthesia Post Note  Patient: [name]  Procedure(s) Performed: Procedure(s) (LRB): RIGHT SHOULDER ARTHROSCOPY WITH LABRAL DEBRIDEMENT LIMITED SUBACROMIAL DECOMPRESSION (Right) RIGHT SUBPECTORAL TENODESIS (Right)  Patient location during evaluation: PACU Anesthesia Type: General and Regional Level of consciousness: awake and alert Pain management: pain level controlled Vital Signs Assessment: post-procedure vital signs reviewed and stable Respiratory status: spontaneous breathing, nonlabored ventilation, respiratory function stable and patient connected to nasal cannula oxygen Cardiovascular status: blood pressure returned to baseline and stable Postop Assessment: no signs of nausea or vomiting Anesthetic complications: no    Last Vitals:  Filed Vitals:   07/12/15 1557 07/12/15 1611  BP: 110/57 111/73  Pulse: 83 76  Temp: 36.6 C 36.7 C  Resp: 17 17    Last Pain: There were no vitals filed for this visit.               Kennieth RadFitzgerald, [name]

## 2015-07-13 NOTE — Op Note (Signed)
NAMWesley [name]:  [name], [name]                 ACCOUNT NO.:  [id number]  MEDICAL RECORD NO.:  [id number]  LOCATION:  [facility]                         FACILITY:  [facility]  PHYSICIAN:  Almedia BallsSteven R. Ranell PatrickNorris, M.D. DATE OF BIRTH:  1965-11-05  DATE OF PROCEDURE:  07/12/2015 DATE OF DISCHARGE:  07/12/2015                              OPERATIVE REPORT   PREOPERATIVE DIAGNOSIS:  Right proximal biceps rupture and superior labrum, anterior and posterior tear.  POSTOPERATIVE DIAGNOSIS:  Right proximal biceps rupture and superior labrum, anterior and posterior tear as well as inferior labral tear and paralabral cyst.  PROCEDURE PERFORMED:  Right shoulder arthroscopy with debridement of superior labrum, anterior and posterior tear and biceps stump as well as debridement of inferior labral tear and decompression of paralabral cyst, subacromial bursectomy.  No acromioplasty was performed followed by open subpectoral tenodesis.  ATTENDING SURGEON:  Almedia BallsSteven R. Ranell PatrickNorris, M.D.  ASSISTANT:  Steve BitterStephen J. Chabon, PA-C.  ANESTHESIA:  General anesthesia was used plus an interscalene block.  ESTIMATED BLOOD LOSS:  Minimal.  FLUID REPLACEMENT:  1200 mL crystalloid.  COUNTS:  Instrument counts were correct.  COMPLICATIONS:  There were no complications.  ANTIBIOTICS:  Perioperative antibiotics were given.  INDICATIONS:  The patient is a 50 year old male with history of an injury to the right arm, which resulted in a pop and immediate deformity of the arm, consistent with long head biceps rupture.  The patient complained of immediate pain and deformity, presented to Orthopedics, where we suspected a long head biceps rupture.  MRI scan confirming a stump of the biceps in the shoulder, no evidence of rotator cuff tear. There was an inferior labral tear and paralabral cyst and then the patient did have nonvisualized biceps tear consistent with rupture.  We discussed options for management with the patient, he was  having cramping in the arm and he elected to proceed given his active lifestyle and he is desire to return to weightlifting, desired shoulder arthroscopy with debridement of the labral tears as well as subpectoral biceps tenodesis.  Risks and benefits of the surgery were discussed. Informed consent obtained.  DESCRIPTION OF PROCEDURE:  After an adequate level of anesthesia was achieved, the patient was positioned in the modified beach-chair position.  Right shoulder was correctly identified and sterilely prepped and draped in usual sterile manner.  Time-out called.  We entered the shoulder using standard arthroscopic portals including anterior and posterolateral portals.  I identified significant tearing of the biceps at the superior labral junction.  We removed that biceps stump with the basket forceps and motorized shaver.  Subscapularis normal, rotator cuff normal as visualized from the anterior-posterior portals.  Articular cartilage normal.  Anterior inferior labrum torn.  We went ahead and used a Therapist, nutritionalreer elevator to decompress the paralabral cyst, yellowish fluid came out of that area, so we had a nice decompression using basket forceps and motorized shaver to debride that inferior labral tear back to stable labral rim.  The remaining labral tissue was felt to be stable.  We then placed a scope in the subacromial space.  Bursectomy was performed.  No acromioplasty, CA ligament preserved, rotator cuff visualized and palpated and felt to be normal.  We concluded the arthroscopic portion of the procedure.  We then went ahead and made a longitudinal incision over the palpable long head biceps tendon distally, this was below the pectoralis insertion on the humerus.  I was able to identify once we went down through the subcutaneous tissues with scissors, identified the biceps tendon, was able to free up the biceps muscle from surrounding tissue or starting to scar together.  We  pulled everything on tension, so that the long head of the biceps and short head of the biceps were in similar tension.  We whipstitched the long head biceps with #2 Hi-Fi suture in a baseball stitch.  We then identified the distal portion of the biceps groove underneath the pectoralis.  We used the needle-tip Bovie to remove the periosteum and then used a Therapist, nutritional to scrape the bone to encourage healing.  We then placed two Y-Knot Flex anchors through the floor of the biceps groove and brought those sutures up through the reinforced biceps tendon in a whipstitch fashion.  One distal, one more proximal.  We then took the suture into the biceps and then brought that through the pec insertion in a Pulvertaft weave, so up and through and then lateral and then back down on itself and sutured to itself.  We also tied the suture anchor sutures down the bones, so we had firm apposition of the bone and also 180-degree bend around the soft tissue and then reinforced by sewing through the pec tendon, thus we had a very strong reinforced subpectoral tenodesis.  We irrigated it thoroughly and then closed the subcu layer with 2-0 Vicryl followed by 4-0 Monocryl for skin and portals.  Steri-Strips applied followed by sterile dressing.  The patient tolerated the surgery well.     Almedia Balls. Ranell Patrick, M.D.     SRN/MEDQ  D:  07/12/2015  T:  07/13/2015  Job:  [id number]

## 2015-07-16 ENCOUNTER — Encounter (HOSPITAL_COMMUNITY): Payer: Self-pay | Admitting: Orthopedic Surgery

## 2015-08-09 DIAGNOSIS — S83241A Other tear of medial meniscus, current injury, right knee, initial encounter: Secondary | ICD-10-CM | POA: Diagnosis not present

## 2015-08-09 DIAGNOSIS — Y999 Unspecified external cause status: Secondary | ICD-10-CM | POA: Diagnosis not present

## 2015-08-09 DIAGNOSIS — M25561 Pain in right knee: Secondary | ICD-10-CM | POA: Diagnosis not present

## 2015-08-20 DIAGNOSIS — Z0189 Encounter for other specified special examinations: Secondary | ICD-10-CM | POA: Diagnosis not present

## 2015-08-20 DIAGNOSIS — M25561 Pain in right knee: Secondary | ICD-10-CM | POA: Diagnosis not present

## 2015-08-20 DIAGNOSIS — Z7189 Other specified counseling: Secondary | ICD-10-CM | POA: Diagnosis not present

## 2015-09-09 DIAGNOSIS — F4001 Agoraphobia with panic disorder: Secondary | ICD-10-CM | POA: Diagnosis not present

## 2015-09-09 DIAGNOSIS — F331 Major depressive disorder, recurrent, moderate: Secondary | ICD-10-CM | POA: Diagnosis not present

## 2015-09-09 DIAGNOSIS — F411 Generalized anxiety disorder: Secondary | ICD-10-CM | POA: Diagnosis not present

## 2015-09-10 DIAGNOSIS — S83241A Other tear of medial meniscus, current injury, right knee, initial encounter: Secondary | ICD-10-CM | POA: Diagnosis not present

## 2015-09-10 DIAGNOSIS — M6751 Plica syndrome, right knee: Secondary | ICD-10-CM | POA: Diagnosis not present

## 2015-09-10 DIAGNOSIS — X58XXXA Exposure to other specified factors, initial encounter: Secondary | ICD-10-CM | POA: Diagnosis not present

## 2015-09-24 DIAGNOSIS — Z4789 Encounter for other orthopedic aftercare: Secondary | ICD-10-CM | POA: Diagnosis not present

## 2015-09-24 DIAGNOSIS — Z9889 Other specified postprocedural states: Secondary | ICD-10-CM | POA: Diagnosis not present

## 2016-04-14 ENCOUNTER — Emergency Department (HOSPITAL_BASED_OUTPATIENT_CLINIC_OR_DEPARTMENT_OTHER)
Admission: EM | Admit: 2016-04-14 | Discharge: 2016-04-14 | Disposition: A | Discharge status: Home / Self Care | Payer: 59 | Attending: Emergency Medicine | Admitting: Emergency Medicine

## 2016-04-14 ENCOUNTER — Emergency Department: Payer: 59

## 2016-04-14 ENCOUNTER — Emergency Department (HOSPITAL_BASED_OUTPATIENT_CLINIC_OR_DEPARTMENT_OTHER): Payer: 59

## 2016-04-14 ENCOUNTER — Encounter (HOSPITAL_BASED_OUTPATIENT_CLINIC_OR_DEPARTMENT_OTHER): Payer: Self-pay | Admitting: Emergency Medicine

## 2016-04-14 DIAGNOSIS — Z87891 Personal history of nicotine dependence: Secondary | ICD-10-CM | POA: Diagnosis not present

## 2016-04-14 DIAGNOSIS — S0181XA Laceration without foreign body of other part of head, initial encounter: Secondary | ICD-10-CM | POA: Diagnosis not present

## 2016-04-14 DIAGNOSIS — T887XXA Unspecified adverse effect of drug or medicament, initial encounter: Secondary | ICD-10-CM | POA: Insufficient documentation

## 2016-04-14 DIAGNOSIS — Y939 Activity, unspecified: Secondary | ICD-10-CM | POA: Insufficient documentation

## 2016-04-14 DIAGNOSIS — R55 Syncope and collapse: Secondary | ICD-10-CM | POA: Insufficient documentation

## 2016-04-14 DIAGNOSIS — S0990XA Unspecified injury of head, initial encounter: Secondary | ICD-10-CM | POA: Diagnosis present

## 2016-04-14 DIAGNOSIS — T50905A Adverse effect of unspecified drugs, medicaments and biological substances, initial encounter: Secondary | ICD-10-CM | POA: Diagnosis not present

## 2016-04-14 DIAGNOSIS — T50995A Adverse effect of other drugs, medicaments and biological substances, initial encounter: Secondary | ICD-10-CM | POA: Diagnosis not present

## 2016-04-14 DIAGNOSIS — W19XXXA Unspecified fall, initial encounter: Secondary | ICD-10-CM | POA: Diagnosis not present

## 2016-04-14 DIAGNOSIS — Y999 Unspecified external cause status: Secondary | ICD-10-CM | POA: Diagnosis not present

## 2016-04-14 DIAGNOSIS — Y929 Unspecified place or not applicable: Secondary | ICD-10-CM | POA: Insufficient documentation

## 2016-04-14 DIAGNOSIS — Y829 Unspecified medical devices associated with adverse incidents: Secondary | ICD-10-CM | POA: Insufficient documentation

## 2016-04-14 DIAGNOSIS — G4733 Obstructive sleep apnea (adult) (pediatric): Secondary | ICD-10-CM | POA: Diagnosis not present

## 2016-04-14 LAB — CBC WITH DIFFERENTIAL/PLATELET
Basophils Absolute: 0 10*3/uL (ref 0.0–0.1)
Basophils Relative: 0 %
Eosinophils Absolute: 0.2 10*3/uL (ref 0.0–0.7)
Eosinophils Relative: 4 %
HCT: 43.3 % (ref 39.0–52.0)
Hemoglobin: 14.2 g/dL (ref 13.0–17.0)
Lymphocytes Relative: 22 %
Lymphs Abs: 1.4 10*3/uL (ref 0.7–4.0)
MCH: 28.1 pg (ref 26.0–34.0)
MCHC: 32.8 g/dL (ref 30.0–36.0)
MCV: 85.6 fL (ref 78.0–100.0)
Monocytes Absolute: 0.4 10*3/uL (ref 0.1–1.0)
Monocytes Relative: 7 %
Neutro Abs: 4.4 10*3/uL (ref 1.7–7.7)
Neutrophils Relative %: 67 %
Platelets: 180 10*3/uL (ref 150–400)
RBC: 5.06 MIL/uL (ref 4.22–5.81)
RDW: 14.5 % (ref 11.5–15.5)
WBC: 6.5 10*3/uL (ref 4.0–10.5)

## 2016-04-14 LAB — URINALYSIS, ROUTINE W REFLEX MICROSCOPIC
Bilirubin Urine: NEGATIVE
Glucose, UA: NEGATIVE mg/dL
Hgb urine dipstick: NEGATIVE
Ketones, ur: NEGATIVE mg/dL
Leukocytes, UA: NEGATIVE
Nitrite: NEGATIVE
Protein, ur: NEGATIVE mg/dL
Specific Gravity, Urine: 1.01 (ref 1.005–1.030)
pH: 6 (ref 5.0–8.0)

## 2016-04-14 LAB — BASIC METABOLIC PANEL
Anion gap: 5 (ref 5–15)
BUN: 14 mg/dL (ref 6–20)
CO2: 30 mmol/L (ref 22–32)
Calcium: 9 mg/dL (ref 8.9–10.3)
Chloride: 102 mmol/L (ref 101–111)
Creatinine, Ser: 1.48 mg/dL — ABNORMAL HIGH (ref 0.61–1.24)
GFR calc Af Amer: >60 mL/min (ref >60)
GFR calc non Af Amer: 54 mL/min — ABNORMAL LOW (ref >60)
Glucose, Bld: 99 mg/dL (ref 65–99)
Potassium: 3.8 mmol/L (ref 3.5–5.1)
Sodium: 137 mmol/L (ref 135–145)

## 2016-04-14 LAB — TROPONIN I: Troponin I: <0.03 ng/mL (ref <0.03)

## 2016-04-14 MED ORDER — LIDOCAINE HCL 1 % IJ SOLN
15.0000 mL | Freq: Once | INTRAMUSCULAR | Status: AC
  Administered 2016-04-14: 15 mL
  Filled 2016-04-14: qty 20

## 2016-04-14 MED ORDER — SODIUM CHLORIDE 0.9 % IV BOLUS (SEPSIS)
1000.0000 mL | Freq: Once | INTRAVENOUS | Status: AC
  Administered 2016-04-14: 1000 mL via INTRAVENOUS

## 2016-04-14 MED ORDER — LIDOCAINE-EPINEPHRINE-TETRACAINE (LET) SOLUTION
3.0000 mL | Freq: Once | NASAL | Status: AC
  Administered 2016-04-14: 3 mL via TOPICAL
  Filled 2016-04-14: qty 3

## 2016-04-14 NOTE — ED Notes (Signed)
Here for "felt light headed when I got up to go the b/r, may have stood up to quickly, got dizzy, fell and hit my chin", admits to syncope, denies feeling bad prior to event. Alert, NAD, calm, interactive, resps e/u, speaking in clear complete sentences, no dyspnea noted, skin W&D, VSS, (denies: sob, nvd, fever, cough congestion cold sx, dizziness, ringing in ears, loose teeth, malocclusion, other injuries or pain, head, neck or back pain or visual changes). [facility]

## 2016-04-14 NOTE — ED Notes (Signed)
Back from CT, alert, NAD, calm, interactive, resps e/u, no dyspnea noted.

## 2016-04-14 NOTE — ED Provider Notes (Addendum)
[facility] Note   CSN: [id number] Arrival date & time: 04/14/16  0228     History   Chief Complaint Chief Complaint  Patient presents with  . Loss of Consciousness  . Laceration    HPI [name] is a 51 y.o. male.  The history is provided by the patient.  Loss of Consciousness   This is a new problem. The current episode started 3 to 5 hours ago. The problem occurs constantly. The problem has been resolved. He lost consciousness for a period of less than one minute. The problem is associated with standing up (on 2 new medications that can cause this trazedone). Associated symptoms include light-headedness. Pertinent negatives include abdominal pain, back pain, bladder incontinence, bowel incontinence, chest pain, clumsiness, confusion, congestion, diaphoresis, dizziness, fever, focal sensory loss, focal weakness, headaches, malaise/fatigue, nausea, palpitations, seizures, slurred speech, vertigo, visual change, vomiting and weakness. He has tried nothing for the symptoms. The treatment provided significant relief. His past medical history does not include CAD or CVA. Past medical history comments: started 2 new meds for sleep and night mares both of which can do this took them both this evening.  Denies chest pain, shortness of breath, DOE, neck pain, pain between the shoulder blades, diaphoresis.  No focal neuro symptoms.  Started trazodone and another medication for nightmares known to affect BP was told to be aware this could happen.    Past Medical History:  Diagnosis Date  . Anemia    child  . Arthritis   . History of anemia 12/13/2012    Patient Active Problem List   Diagnosis Date Noted  . History of anemia 12/13/2012    Past Surgical History:  Procedure Laterality Date  . BICEPT TENODESIS Right 07/12/2015   Procedure: RIGHT SUBPECTORAL TENODESIS;  Surgeon: Beverely Low, MD;  Location: [facility] OR;  Service: Orthopedics;  Laterality: Right;  . ELBOW  SURGERY Right 2014   tendon  . HAND SURGERY Right 10/2011   tendon rupture  . SHOULDER ARTHROSCOPY WITH SUBACROMIAL DECOMPRESSION Right 07/12/2015   Procedure: RIGHT SHOULDER ARTHROSCOPY WITH LABRAL DEBRIDEMENT LIMITED SUBACROMIAL DECOMPRESSION;  Surgeon: Beverely Low, MD;  Location: [facility] OR;  Service: Orthopedics;  Laterality: Right;  ANESTHESIA:  CHOICE WITH INTERSCALENE BLOCK       Home Medications    Prior to Admission medications   Medication Sig Start Date End Date Taking? Authorizing Provider  methocarbamol (ROBAXIN) 500 MG tablet Take 1 tablet (500 mg total) by mouth 3 (three) times daily as needed. 07/12/15   Beverely Low, MD  Multiple Vitamin (MULTIVITAMIN) tablet Take 1 tablet by mouth daily.    Historical Provider, MD  oxyCODONE-acetaminophen (ROXICET) 5-325 MG tablet Take 1-2 tablets by mouth every 4 (four) hours as needed for severe pain. 07/12/15   Beverely Low, MD  tetrahydrozoline-zinc (VISINE-AC) 0.05-0.25 % ophthalmic solution Place 1 drop into both eyes daily as needed (for dry eyes).     Historical Provider, MD    Family History Family History  Problem Relation Age of Onset  . Alcoholism    . Heart attack    . Stroke    . Cancer      lung,throat,brain    Social History Social History  Substance Use Topics  . Smoking status: Former Smoker    Types: Cigarettes    Quit date: 07/10/2006  . Smokeless tobacco: Never Used     Comment: e-cig  . Alcohol use 1.2 oz/week    2 Cans of beer per  week     Allergies   Patient has no known allergies.   Review of Systems Review of Systems  Constitutional: Negative for chills, diaphoresis, fever and malaise/fatigue.  HENT: Negative for congestion.   Respiratory: Negative for chest tightness and shortness of breath.   Cardiovascular: Positive for syncope. Negative for chest pain, palpitations and leg swelling.  Gastrointestinal: Negative for abdominal pain, bowel incontinence, nausea and vomiting.  Genitourinary:  Negative for bladder incontinence and dysuria.  Musculoskeletal: Negative for back pain and neck pain.  Skin: Positive for wound.  Neurological: Positive for syncope and light-headedness. Negative for dizziness, vertigo, tremors, focal weakness, seizures, facial asymmetry, speech difficulty, weakness, numbness and headaches.  Psychiatric/Behavioral: Negative for confusion.  All other systems reviewed and are negative.    Physical Exam Updated Vital Signs BP 124/72 (BP Location: Left Arm)   Pulse 61   Temp 97.7 F (36.5 C) (Oral)   Resp 16   Wt 180 lb (81.6 kg)   SpO2 100%   BMI 27.37 kg/m   Physical Exam  Constitutional: He is oriented to person, place, and time. He appears well-developed and well-nourished. No distress.  HENT:  Head: Normocephalic. Head is without raccoon's eyes and without Battle's sign.    Right Ear: External ear normal. No hemotympanum.  Left Ear: External ear normal. No hemotympanum.  Mouth/Throat: No oropharyngeal exudate.  Eyes: Conjunctivae and EOM are normal. Pupils are equal, round, and reactive to light.  Neck: Normal range of motion. Neck supple. No JVD present.  Cardiovascular: Normal rate, regular rhythm and intact distal pulses.   Pulmonary/Chest: Effort normal and breath sounds normal. No stridor. He has no wheezes. He has no rales.  Abdominal: Soft. Bowel sounds are normal. He exhibits no mass. There is no tenderness. There is no rebound and no guarding.  Musculoskeletal: Normal range of motion. He exhibits no edema, tenderness or deformity.       Cervical back: Normal.       Thoracic back: Normal.  Lymphadenopathy:    He has no cervical adenopathy.  Neurological: He is alert and oriented to person, place, and time. He displays normal reflexes. No cranial nerve deficit or sensory deficit. He exhibits normal muscle tone.  Skin: Skin is warm and dry. Capillary refill takes less than 2 seconds. He is not diaphoretic.  Psychiatric: He has a  normal mood and affect.     ED Treatments / Results   Vitals:   04/14/16 0332 04/14/16 0432  BP: 104/69 127/72  Pulse: 75 73  Resp: 14 13  Temp:     Results for orders placed or performed during the hospital encounter of 04/14/16  CBC with Differential/Platelet  Result Value Ref Range   WBC 6.5 4.0 - 10.5 K/uL   RBC 5.06 4.22 - 5.81 MIL/uL   Hemoglobin 14.2 13.0 - 17.0 g/dL   HCT 21.343.3 08.639.0 - 57.852.0 %   MCV 85.6 78.0 - 100.0 fL   MCH 28.1 26.0 - 34.0 pg   MCHC 32.8 30.0 - 36.0 g/dL   RDW 46.914.5 62.911.5 - 52.815.5 %   Platelets 180 150 - 400 K/uL   Neutrophils Relative % 67 %   Neutro Abs 4.4 1.7 - 7.7 K/uL   Lymphocytes Relative 22 %   Lymphs Abs 1.4 0.7 - 4.0 K/uL   Monocytes Relative 7 %   Monocytes Absolute 0.4 0.1 - 1.0 K/uL   Eosinophils Relative 4 %   Eosinophils Absolute 0.2 0.0 - 0.7 K/uL   Basophils  Relative 0 %   Basophils Absolute 0.0 0.0 - 0.1 K/uL  Basic metabolic panel  Result Value Ref Range   Sodium 137 135 - 145 mmol/L   Potassium 3.8 3.5 - 5.1 mmol/L   Chloride 102 101 - 111 mmol/L   CO2 30 22 - 32 mmol/L   Glucose, Bld 99 65 - 99 mg/dL   BUN 14 6 - 20 mg/dL   Creatinine, Ser 6.04 (H) 0.61 - 1.24 mg/dL   Calcium 9.0 8.9 - 54.0 mg/dL   GFR calc non Af Amer 54 (L) >60 mL/min   GFR calc Af Amer >60 >60 mL/min   Anion gap 5 5 - 15  Troponin I  Result Value Ref Range   Troponin I <0.03 <0.03 ng/mL  Urinalysis, Routine w reflex microscopic  Result Value Ref Range   Color, Urine YELLOW YELLOW   APPearance CLEAR CLEAR   Specific Gravity, Urine 1.010 1.005 - 1.030   pH 6.0 5.0 - 8.0   Glucose, UA NEGATIVE NEGATIVE mg/dL   Hgb urine dipstick NEGATIVE NEGATIVE   Bilirubin Urine NEGATIVE NEGATIVE   Ketones, ur NEGATIVE NEGATIVE mg/dL   Protein, ur NEGATIVE NEGATIVE mg/dL   Nitrite NEGATIVE NEGATIVE   Leukocytes, UA NEGATIVE NEGATIVE   Dg Chest 2 View  Result Date: 04/14/2016 CLINICAL DATA:  Syncopal episode EXAM: CHEST  2 VIEW COMPARISON:  None. FINDINGS:  The heart size and mediastinal contours are within normal limits. Both lungs are clear. The visualized skeletal structures are unremarkable. IMPRESSION: No active cardiopulmonary disease. Electronically Signed   By: Jasmine Pang M.D.   On: 04/14/2016 03:43   Ct Head Wo Contrast  Result Date: 04/14/2016 CLINICAL DATA:  Syncopal episode EXAM: CT HEAD WITHOUT CONTRAST TECHNIQUE: Contiguous axial images were obtained from the base of the skull through the vertex without intravenous contrast. COMPARISON:  None. FINDINGS: Brain: No evidence of acute infarction, hemorrhage, hydrocephalus, extra-axial collection or mass lesion/mass effect. Vascular: No hyperdense vessel or unexpected calcification. Skull: Normal. Negative for fracture or focal lesion. Sinuses/Orbits: Mucosal thickening within the ethmoid sinuses. No acute orbital abnormality. Other: None IMPRESSION: No CT evidence for acute intracranial abnormality. Electronically Signed   By: Jasmine Pang M.D.   On: 04/14/2016 03:24    EKG  EKG Interpretation  Date/Time:  Tuesday April 14 2016 02:51:23 EST Ventricular Rate:  64 PR Interval:    QRS Duration: 102 QT Interval:  406 QTC Calculation: 419 R Axis:   71 Text Interpretation:  Sinus rhythm motion artifact Confirmed by [facility]  MD, Kojo Liby ([id number]) on 04/14/2016 3:00:10 AM       Radiology Dg Chest 2 View  Result Date: 04/14/2016 CLINICAL DATA:  Syncopal episode EXAM: CHEST  2 VIEW COMPARISON:  None. FINDINGS: The heart size and mediastinal contours are within normal limits. Both lungs are clear. The visualized skeletal structures are unremarkable. IMPRESSION: No active cardiopulmonary disease. Electronically Signed   By: Jasmine Pang M.D.   On: 04/14/2016 03:43   Ct Head Wo Contrast  Result Date: 04/14/2016 CLINICAL DATA:  Syncopal episode EXAM: CT HEAD WITHOUT CONTRAST TECHNIQUE: Contiguous axial images were obtained from the base of the skull through the vertex without  intravenous contrast. COMPARISON:  None. FINDINGS: Brain: No evidence of acute infarction, hemorrhage, hydrocephalus, extra-axial collection or mass lesion/mass effect. Vascular: No hyperdense vessel or unexpected calcification. Skull: Normal. Negative for fracture or focal lesion. Sinuses/Orbits: Mucosal thickening within the ethmoid sinuses. No acute orbital abnormality. Other: None IMPRESSION: No CT evidence for  acute intracranial abnormality. Electronically Signed   By: Jasmine Pang M.D.   On: 04/14/2016 03:24    Procedures .Marland KitchenLaceration Repair Date/Time: 04/14/2016 5:38 AM Performed by: Cy Blamer Authorized by: Cy Blamer   Consent:    Consent obtained:  Verbal   Consent given by:  Patient   Risks discussed:  Infection and need for additional repair   Alternatives discussed:  No treatment Anesthesia (see MAR for exact dosages):    Anesthesia method:  Topical application and local infiltration   Topical anesthetic:  LET   Local anesthetic:  Lidocaine 1% w/o epi Laceration details:    Location:  Face (chin)   Length (cm):  1.5   Depth (mm):  2 Repair type:    Repair type:  Intermediate Pre-procedure details:    Preparation:  Patient was prepped and draped in usual sterile fashion Exploration:    Hemostasis achieved with:  LET   Wound exploration: wound explored through full range of motion     Wound extent: no areolar tissue violation noted  Foreign body: 10.   Treatment:    Area cleansed with:  Betadine   Amount of cleaning:  Standard   Irrigation solution:  Sterile saline   Irrigation method:  Syringe   Visualized foreign bodies/material removed: no   Skin repair:    Repair method:  Sutures   Suture size:  4-0   Suture material:  Nylon   Suture technique:  Simple interrupted   Number of sutures:  6 Approximation:    Approximation:  Close   Vermilion border: well-aligned   Post-procedure details:    Dressing:  Sterile dressing   Patient tolerance of  procedure:  Tolerated well, no immediate complications   (including critical care time)  Medications Ordered in ED  Medications  lidocaine-EPINEPHrine-tetracaine (LET) solution (3 mLs Topical Given 04/14/16 0340)  sodium chloride 0.9 % bolus 1,000 mL (0 mLs Intravenous Stopped 04/14/16 0440)  lidocaine (XYLOCAINE) 1 % (with pres) injection 15 mL (15 mLs Infiltration Given 04/14/16 0528)     Denies chest pain or other symptoms  Final Clinical Impressions(s) / ED Diagnoses  Chin laceration/medication effect: suture removal in 5 days.  Stop medications.   All questions answered to patient's satisfaction. Based on history and exam patient has been appropriately medically screened and emergency conditions excluded. Patient is stable for discharge at this time. Strict return precautions given for chest pain, weakness numbness, neck pain shoulder pain passing out, shortness of breath, difficulty speaking or swallowing or any further problems or concerns New Prescriptions New Prescriptions   No medications on file     Teigan Manner, MD 04/14/16 0541    Lindsea Olivar, MD 04/14/16 [phone]

## 2016-04-14 NOTE — ED Notes (Addendum)
Pt not in room, pt in CT/ xray.

## 2016-04-14 NOTE — ED Triage Notes (Signed)
Pt reports getting up to go to bathroom and having a syncopal episode. Pt has laceration to chin and swelling to left cheek. Wife states she awoke to hearing pt fall and found pt in bathroom floor.

## 2016-04-14 NOTE — ED Notes (Signed)
EDP into room, prior to RN assessment, see MD notes, pending orders.   

## 2016-04-14 NOTE — ED Notes (Signed)
[facility], pt alert, NAD, calm, interactive, resps e/u, wife present.

## 2016-04-14 NOTE — ED Notes (Signed)
Patient requested blanket - temperature normal

## 2016-04-15 DIAGNOSIS — S0993XA Unspecified injury of face, initial encounter: Secondary | ICD-10-CM | POA: Diagnosis not present

## 2016-04-15 DIAGNOSIS — S0990XA Unspecified injury of head, initial encounter: Secondary | ICD-10-CM | POA: Diagnosis not present

## 2016-05-19 DIAGNOSIS — Z0001 Encounter for general adult medical examination with abnormal findings: Secondary | ICD-10-CM | POA: Diagnosis not present

## 2016-05-27 DIAGNOSIS — Y999 Unspecified external cause status: Secondary | ICD-10-CM | POA: Diagnosis not present

## 2016-05-27 DIAGNOSIS — S83242A Other tear of medial meniscus, current injury, left knee, initial encounter: Secondary | ICD-10-CM | POA: Diagnosis not present

## 2016-06-30 DIAGNOSIS — Z01812 Encounter for preprocedural laboratory examination: Secondary | ICD-10-CM | POA: Diagnosis not present

## 2016-07-07 DIAGNOSIS — G4733 Obstructive sleep apnea (adult) (pediatric): Secondary | ICD-10-CM | POA: Diagnosis not present

## 2016-07-14 DIAGNOSIS — S83242A Other tear of medial meniscus, current injury, left knee, initial encounter: Secondary | ICD-10-CM | POA: Diagnosis not present

## 2016-07-14 DIAGNOSIS — X58XXXA Exposure to other specified factors, initial encounter: Secondary | ICD-10-CM | POA: Diagnosis not present

## 2016-07-21 DIAGNOSIS — M25512 Pain in left shoulder: Secondary | ICD-10-CM | POA: Diagnosis not present

## 2016-10-05 DIAGNOSIS — M25562 Pain in left knee: Secondary | ICD-10-CM | POA: Diagnosis not present

## 2016-10-05 DIAGNOSIS — M25512 Pain in left shoulder: Secondary | ICD-10-CM | POA: Diagnosis not present

## 2016-10-16 ENCOUNTER — Ambulatory Visit (INDEPENDENT_AMBULATORY_CARE_PROVIDER_SITE_OTHER): Payer: 59 | Admitting: Emergency Medicine

## 2016-10-16 ENCOUNTER — Encounter: Payer: Self-pay | Admitting: Emergency Medicine

## 2016-10-16 VITALS — BP 146/78 | HR 90 | Temp 98.6°F | Resp 16 | Ht 68.5 in | Wt 196.8 lb

## 2016-10-16 DIAGNOSIS — Z0000 Encounter for general adult medical examination without abnormal findings: Secondary | ICD-10-CM

## 2016-10-16 NOTE — Progress Notes (Signed)
[name] 51 y.o.   Chief Complaint  Patient presents with  . Annual Exam    MASONS    HISTORY OF PRESENT ILLNESS: This is a 51 y.o. male here for annual exam; needs to have medical health certificate filled out. No concerns or medical issues identified.  HPI   Prior to Admission medications   Medication Sig Start Date End Date Taking? Authorizing Provider  Multiple Vitamin (MULTIVITAMIN) tablet Take 1 tablet by mouth daily.   Yes [provider]  methocarbamol (ROBAXIN) 500 MG tablet Take 1 tablet (500 mg total) by mouth 3 (three) times daily as needed. Patient not taking: Reported on 10/16/2016 07/12/15   Beverely Low, MD  oxyCODONE-acetaminophen (ROXICET) 5-325 MG tablet Take 1-2 tablets by mouth every 4 (four) hours as needed for severe pain. Patient not taking: Reported on 10/16/2016 07/12/15   Beverely Low, MD  tetrahydrozoline-zinc (VISINE-AC) 0.05-0.25 % ophthalmic solution Place 1 drop into both eyes daily as needed (for dry eyes).     [provider]    No Known Allergies  Patient Active Problem List   Diagnosis Date Noted  . History of anemia 12/13/2012    Past Medical History:  Diagnosis Date  . Anemia    child  . Arthritis   . History of anemia 12/13/2012    Past Surgical History:  Procedure Laterality Date  . BICEPT TENODESIS Right 07/12/2015   Procedure: RIGHT SUBPECTORAL TENODESIS;  Surgeon: Beverely Low, MD;  Location: [facility] OR;  Service: Orthopedics;  Laterality: Right;  . ELBOW SURGERY Right 2014   tendon  . HAND SURGERY Right 10/2011   tendon rupture  . SHOULDER ARTHROSCOPY WITH SUBACROMIAL DECOMPRESSION Right 07/12/2015   Procedure: RIGHT SHOULDER ARTHROSCOPY WITH LABRAL DEBRIDEMENT LIMITED SUBACROMIAL DECOMPRESSION;  Surgeon: Beverely Low, MD;  Location: [facility] OR;  Service: Orthopedics;  Laterality: Right;  ANESTHESIA:  CHOICE WITH INTERSCALENE BLOCK    Social History   Social History  . Marital status: Single    Spouse name:  N/A  . Number of children: N/A  . Years of education: N/A   Occupational History  . Not on file.   Social History Main Topics  . Smoking status: Former Smoker    Types: Cigarettes    Quit date: 07/10/2006  . Smokeless tobacco: Never Used     Comment: e-cig  . Alcohol use 1.2 oz/week    2 Cans of beer per week  . Drug use: No     Comment: pack a week  . Sexual activity: Yes   Other Topics Concern  . Not on file   Social History Narrative  . No narrative on file    Family History  Problem Relation Age of Onset  . Alcoholism Unknown   . Heart attack Unknown   . Stroke Unknown   . Cancer Unknown        lung,throat,brain     Review of Systems  Constitutional: Negative.  Negative for chills, fever and weight loss.  HENT: Negative.  Negative for congestion and sore throat.   Eyes: Negative.  Negative for blurred vision and double vision.  Respiratory: Negative.  Negative for cough and shortness of breath.   Cardiovascular: Negative.  Negative for chest pain and palpitations.  Gastrointestinal: Negative.  Negative for abdominal pain, diarrhea, nausea and vomiting.  Genitourinary: Negative.  Negative for dysuria and hematuria.  Musculoskeletal: Negative.  Negative for myalgias and neck pain.  Skin: Negative.  Negative for rash.  Neurological: Negative for dizziness  and headaches.  Endo/Heme/Allergies: Negative.   All other systems reviewed and are negative.  Vitals:   10/16/16 1019  BP: (!) 146/78  Pulse: 90  Resp: 16  Temp: 98.6 F (37 C)  SpO2: 98%     Physical Exam  Constitutional: He is oriented to person, place, and time. He appears well-developed and well-nourished.  HENT:  Head: Normocephalic and atraumatic.  Nose: Nose normal.  Mouth/Throat: Oropharynx is clear and moist.  Eyes: Pupils are equal, round, and reactive to light. Conjunctivae and EOM are normal.  Neck: Normal range of motion. Neck supple. No JVD present.  Cardiovascular: Normal rate,  regular rhythm, normal heart sounds and intact distal pulses.   Pulmonary/Chest: Effort normal and breath sounds normal.  Abdominal: Soft. Bowel sounds are normal. He exhibits no distension. There is no tenderness.  Musculoskeletal: Normal range of motion.  Lymphadenopathy:    He has no cervical adenopathy.  Neurological: He is alert and oriented to person, place, and time. No sensory deficit. He exhibits normal muscle tone.  Skin: Skin is warm and dry. Capillary refill takes less than 2 seconds. No rash noted.  Psychiatric: He has a normal mood and affect. His behavior is normal.  Vitals reviewed.    ASSESSMENT & PLAN:  Uzziah was seen today for annual exam.  Diagnoses and all orders for this visit:  Routine general medical examination at a health care facility -     CBC with Differential -     Comprehensive metabolic panel -     Hemoglobin A1c -     Lipid panel -     TSH -     HIV antibody    Patient Instructions       IF you received an x-ray today, you will receive an invoice from [facility]. Please contact [facility] at [phone] with questions or concerns regarding your invoice.   IF you received labwork today, you will receive an invoice from [facility]. Please contact [facility] at [phone] with questions or concerns regarding your invoice.   Our billing staff will not be able to assist you with questions regarding bills from these companies.  You will be contacted with the lab results as soon as they are available. The fastest way to get your results is to activate your My Chart account. Instructions are located on the last page of this paperwork. If you have not heard from [place] regarding the results in 2 weeks, please contact this office.        Health Maintenance, Male A healthy lifestyle and preventive care is important for your health and wellness. Ask your health care provider about what schedule of regular examinations is right for  you. What should I know about weight and diet? Eat a Healthy Diet  Eat plenty of vegetables, fruits, whole grains, low-fat dairy products, and lean protein.  Do not eat a lot of foods high in solid fats, added sugars, or salt.  Maintain a Healthy Weight Regular exercise can help you achieve or maintain a healthy weight. You should:  Do at least 150 minutes of exercise each week. The exercise should increase your heart rate and make you sweat (moderate-intensity exercise).  Do strength-training exercises at least twice a week.  Watch Your Levels of Cholesterol and Blood Lipids  Have your blood tested for lipids and cholesterol every 5 years starting at 50 years of age. If you are at high risk for heart disease, you should start having your blood tested when  you are 50 years old. You may need to have your cholesterol levels checked more often if: ? Your lipid or cholesterol levels are high. ? You are older than 50 years of age. ? You are at high risk for heart disease.  What should I know about cancer screening? Many types of cancers can be detected early and may often be prevented. Lung Cancer  You should be screened every year for lung cancer if: ? You are a current smoker who has smoked for at least 30 years. ? You are a former smoker who has quit within the past 15 years.  Talk to your health care provider about your screening options, when you should start screening, and how often you should be screened.  Colorectal Cancer  Routine colorectal cancer screening usually begins at 50 years of age and should be repeated every 5-10 years until you are 50 years old. You may need to be screened more often if early forms of precancerous polyps or small growths are found. Your health care provider may recommend screening at an earlier age if you have risk factors for colon cancer.  Your health care provider may recommend using home test kits to check for hidden blood in the stool.  A  small camera at the end of a tube can be used to examine your colon (sigmoidoscopy or colonoscopy). This checks for the earliest forms of colorectal cancer.  Prostate and Testicular Cancer  Depending on your age and overall health, your health care provider may do certain tests to screen for prostate and testicular cancer.  Talk to your health care provider about any symptoms or concerns you have about testicular or prostate cancer.  Skin Cancer  Check your skin from head to toe regularly.  Tell your health care provider about any new moles or changes in moles, especially if: ? There is a change in a mole's size, shape, or color. ? You have a mole that is larger than a pencil eraser.  Always use sunscreen. Apply sunscreen liberally and repeat throughout the day.  Protect yourself by wearing long sleeves, pants, a wide-brimmed hat, and sunglasses when outside.  What should I know about heart disease, diabetes, and high blood pressure?  If you are 26-85 years of age, have your blood pressure checked every 3-5 years. If you are 40 years of age or older, have your blood pressure checked every year. You should have your blood pressure measured twice-once when you are at a hospital or clinic, and once when you are not at a hospital or clinic. Record the average of the two measurements. To check your blood pressure when you are not at a hospital or clinic, you can use: ? An automated blood pressure machine at a pharmacy. ? A home blood pressure monitor.  Talk to your health care provider about your target blood pressure.  If you are between 42-22 years old, ask your health care provider if you should take aspirin to prevent heart disease.  Have regular diabetes screenings by checking your fasting blood sugar level. ? If you are at a normal weight and have a low risk for diabetes, have this test once every three years after the age of 33. ? If you are overweight and have a high risk for  diabetes, consider being tested at a younger age or more often.  A one-time screening for abdominal aortic aneurysm (AAA) by ultrasound is recommended for men aged 65-75 years who are current or former smokers.  What should I know about preventing infection? Hepatitis B If you have a higher risk for hepatitis B, you should be screened for this virus. Talk with your health care provider to find out if you are at risk for hepatitis B infection. Hepatitis C Blood testing is recommended for:  Everyone born from [birth year] through 1964.  Anyone with known risk factors for hepatitis C.  Sexually Transmitted Diseases (STDs)  You should be screened each year for STDs including gonorrhea and chlamydia if: ? You are sexually active and are younger than 50 years of age. ? You are older than 50 years of age and your health care provider tells you that you are at risk for this type of infection. ? Your sexual activity has changed since you were last screened and you are at an increased risk for chlamydia or gonorrhea. Ask your health care provider if you are at risk.  Talk with your health care provider about whether you are at high risk of being infected with HIV. Your health care provider may recommend a prescription medicine to help prevent HIV infection.  What else can I do?  Schedule regular health, dental, and eye exams.  Stay current with your vaccines (immunizations).  Do not use any tobacco products, such as cigarettes, chewing tobacco, and e-cigarettes. If you need help quitting, ask your health care provider.  Limit alcohol intake to no more than 2 drinks per day. One drink equals 12 ounces of beer, 5 ounces of wine, or 1 ounces of hard liquor.  Do not use street drugs.  Do not share needles.  Ask your health care provider for help if you need support or information about quitting drugs.  Tell your health care provider if you often feel depressed.  Tell your health care provider if  you have ever been abused or do not feel safe at home. This information is not intended to replace advice given to you by your health care provider. Make sure you discuss any questions you have with your health care provider. Document Released: 08/08/2007 Document Revised: 10/09/2015 Document Reviewed: 11/13/2014 Elsevier Interactive Patient Education  2018 [employer].  American Heart Association (AHA) Exercise Recommendation  Being physically active is important to prevent heart disease and stroke, the nation's No. 1and No. 5killers. To improve overall cardiovascular health, we suggest at least 150 minutes per week of moderate exercise or 75 minutes per week of vigorous exercise (or a combination of moderate and vigorous activity). Thirty minutes a day, five times a week is an easy goal to remember. You will also experience benefits even if you divide your time into two or three segments of 10 to 15 minutes per day.  For people who would benefit from lowering their blood pressure or cholesterol, we recommend 40 minutes of aerobic exercise of moderate to vigorous intensity three to four times a week to lower the risk for heart attack and stroke.  Physical activity is anything that makes you move your body and burn calories.  This includes things like climbing stairs or playing sports. Aerobic exercises benefit your heart, and include walking, jogging, swimming or biking. Strength and stretching exercises are best for overall stamina and flexibility.  The simplest, positive change you can make to effectively improve your heart health is to start walking. It's enjoyable, free, easy, social and great exercise. A walking program is flexible and boasts high success rates because people can stick with it. It's easy for walking to become a regular and  satisfying part of life.   For Overall Cardiovascular Health:  At least 30 minutes of moderate-intensity aerobic activity at least 5 days per week for a  total of 150  OR   At least 25 minutes of vigorous aerobic activity at least 3 days per week for a total of 75 minutes; or a combination of moderate- and vigorous-intensity aerobic activity  AND   Moderate- to high-intensity muscle-strengthening activity at least 2 days per week for additional health benefits.  For Lowering Blood Pressure and Cholesterol  An average 40 minutes of moderate- to vigorous-intensity aerobic activity 3 or 4 times per week  What if I can't make it to the time goal? Something is always better than nothing! And everyone has to start somewhere. Even if you've been sedentary for years, today is the day you can begin to make healthy changes in your life. If you don't think you'll make it for 30 or 40 minutes, set a reachable goal for today. You can work up toward your overall goal by increasing your time as you get stronger. Don't let all-or-nothing thinking rob you of doing what you can every day.  Source:[email or web address], MD [facility]

## 2016-10-16 NOTE — Patient Instructions (Addendum)
   IF you received an x-ray today, you will receive an invoice from [facility]. Please contact [facility] at [phone] with questions or concerns regarding your invoice.   IF you received labwork today, you will receive an invoice from [facility]. Please contact [facility] at [phone] with questions or concerns regarding your invoice.   Our billing staff will not be able to assist you with questions regarding bills from these companies.  You will be contacted with the lab results as soon as they are available. The fastest way to get your results is to activate your My Chart account. Instructions are located on the last page of this paperwork. If you have not heard from us regarding the results in 2 weeks, please contact this office.      Health Maintenance, Male A healthy lifestyle and preventive care is important for your health and wellness. Ask your health care provider about what schedule of regular examinations is right for you. What should I know about weight and diet? Eat a Healthy Diet  Eat plenty of vegetables, fruits, whole grains, low-fat dairy products, and lean protein.  Do not eat a lot of foods high in solid fats, added sugars, or salt.  Maintain a Healthy Weight Regular exercise can help you achieve or maintain a healthy weight. You should:  Do at least 150 minutes of exercise each week. The exercise should increase your heart rate and make you sweat (moderate-intensity exercise).  Do strength-training exercises at least twice a week.  Watch Your Levels of Cholesterol and Blood Lipids  Have your blood tested for lipids and cholesterol every 5 years starting at 50 years of age. If you are at high risk for heart disease, you should start having your blood tested when you are 50 years old. You may need to have your cholesterol levels checked more often if: ? Your lipid or cholesterol levels are high. ? You are older than 50 years of age. ? You  are at high risk for heart disease.  What should I know about cancer screening? Many types of cancers can be detected early and may often be prevented. Lung Cancer  You should be screened every year for lung cancer if: ? You are a current smoker who has smoked for at least 30 years. ? You are a former smoker who has quit within the past 15 years.  Talk to your health care provider about your screening options, when you should start screening, and how often you should be screened.  Colorectal Cancer  Routine colorectal cancer screening usually begins at 50 years of age and should be repeated every 5-10 years until you are 50 years old. You may need to be screened more often if early forms of precancerous polyps or small growths are found. Your health care provider may recommend screening at an earlier age if you have risk factors for colon cancer.  Your health care provider may recommend using home test kits to check for hidden blood in the stool.  A small camera at the end of a tube can be used to examine your colon (sigmoidoscopy or colonoscopy). This checks for the earliest forms of colorectal cancer.  Prostate and Testicular Cancer  Depending on your age and overall health, your health care provider may do certain tests to screen for prostate and testicular cancer.  Talk to your health care provider about any symptoms or concerns you have about testicular or prostate cancer.  Skin Cancer  Check your skin   from head to toe regularly.  Tell your health care provider about any new moles or changes in moles, especially if: ? There is a change in a mole's size, shape, or color. ? You have a mole that is larger than a pencil eraser.  Always use sunscreen. Apply sunscreen liberally and repeat throughout the day.  Protect yourself by wearing long sleeves, pants, a wide-brimmed hat, and sunglasses when outside.  What should I know about heart disease, diabetes, and high blood  pressure?  If you are 18-39 years of age, have your blood pressure checked every 3-5 years. If you are 40 years of age or older, have your blood pressure checked every year. You should have your blood pressure measured twice-once when you are at a hospital or clinic, and once when you are not at a hospital or clinic. Record the average of the two measurements. To check your blood pressure when you are not at a hospital or clinic, you can use: ? An automated blood pressure machine at a pharmacy. ? A home blood pressure monitor.  Talk to your health care provider about your target blood pressure.  If you are between 45-79 years old, ask your health care provider if you should take aspirin to prevent heart disease.  Have regular diabetes screenings by checking your fasting blood sugar level. ? If you are at a normal weight and have a low risk for diabetes, have this test once every three years after the age of 45. ? If you are overweight and have a high risk for diabetes, consider being tested at a younger age or more often.  A one-time screening for abdominal aortic aneurysm (AAA) by ultrasound is recommended for men aged 65-75 years who are current or former smokers. What should I know about preventing infection? Hepatitis B If you have a higher risk for hepatitis B, you should be screened for this virus. Talk with your health care provider to find out if you are at risk for hepatitis B infection. Hepatitis C Blood testing is recommended for:  Everyone born from 1945 through 1965.  Anyone with known risk factors for hepatitis C.  Sexually Transmitted Diseases (STDs)  You should be screened each year for STDs including gonorrhea and chlamydia if: ? You are sexually active and are younger than 50 years of age. ? You are older than 50 years of age and your health care provider tells you that you are at risk for this type of infection. ? Your sexual activity has changed since you were last  screened and you are at an increased risk for chlamydia or gonorrhea. Ask your health care provider if you are at risk.  Talk with your health care provider about whether you are at high risk of being infected with HIV. Your health care provider may recommend a prescription medicine to help prevent HIV infection.  What else can I do?  Schedule regular health, dental, and eye exams.  Stay current with your vaccines (immunizations).  Do not use any tobacco products, such as cigarettes, chewing tobacco, and e-cigarettes. If you need help quitting, ask your health care provider.  Limit alcohol intake to no more than 2 drinks per day. One drink equals 12 ounces of beer, 5 ounces of wine, or 1 ounces of hard liquor.  Do not use street drugs.  Do not share needles.  Ask your health care provider for help if you need support or information about quitting drugs.  Tell your health care   provider if you often feel depressed.  Tell your health care provider if you have ever been abused or do not feel safe at home. This information is not intended to replace advice given to you by your health care provider. Make sure you discuss any questions you have with your health care provider. Document Released: 08/08/2007 Document Revised: 10/09/2015 Document Reviewed: 11/13/2014 Elsevier Interactive Patient Education  2018 [employer] Inc.  American Heart Association (AHA) Exercise Recommendation  Being physically active is important to prevent heart disease and stroke, the nation's No. 1and No. 5killers. To improve overall cardiovascular health, we suggest at least 150 minutes per week of moderate exercise or 75 minutes per week of vigorous exercise (or a combination of moderate and vigorous activity). Thirty minutes a day, five times a week is an easy goal to remember. You will also experience benefits even if you divide your time into two or three segments of 10 to 15 minutes per day.  For people who would  benefit from lowering their blood pressure or cholesterol, we recommend 40 minutes of aerobic exercise of moderate to vigorous intensity three to four times a week to lower the risk for heart attack and stroke.  Physical activity is anything that makes you move your body and burn calories.  This includes things like climbing stairs or playing sports. Aerobic exercises benefit your heart, and include walking, jogging, swimming or biking. Strength and stretching exercises are best for overall stamina and flexibility.  The simplest, positive change you can make to effectively improve your heart health is to start walking. It's enjoyable, free, easy, social and great exercise. A walking program is flexible and boasts high success rates because people can stick with it. It's easy for walking to become a regular and satisfying part of life.   For Overall Cardiovascular Health:  At least 30 minutes of moderate-intensity aerobic activity at least 5 days per week for a total of 150  OR   At least 25 minutes of vigorous aerobic activity at least 3 days per week for a total of 75 minutes; or a combination of moderate- and vigorous-intensity aerobic activity  AND   Moderate- to high-intensity muscle-strengthening activity at least 2 days per week for additional health benefits.  For Lowering Blood Pressure and Cholesterol  An average 40 minutes of moderate- to vigorous-intensity aerobic activity 3 or 4 times per week  What if I can't make it to the time goal? Something is always better than nothing! And everyone has to start somewhere. Even if you've been sedentary for years, today is the day you can begin to make healthy changes in your life. If you don't think you'll make it for 30 or 40 minutes, set a reachable goal for today. You can work up toward your overall goal by increasing your time as you get stronger. Don't let all-or-nothing thinking rob you of doing what you can every day.   Source:http://www.heart.org    

## 2016-10-17 LAB — CBC WITH DIFFERENTIAL/PLATELET
Basophils Absolute: 0 10*3/uL (ref 0.0–0.2)
Basos: 1 %
EOS (ABSOLUTE): 0.2 10*3/uL (ref 0.0–0.4)
Eos: 5 %
Hematocrit: 42.2 % (ref 37.5–51.0)
Hemoglobin: 14.1 g/dL (ref 13.0–17.7)
Immature Grans (Abs): 0 10*3/uL (ref 0.0–0.1)
Immature Granulocytes: 0 %
Lymphocytes Absolute: 2.1 10*3/uL (ref 0.7–3.1)
Lymphs: 43 %
MCH: 27.5 pg (ref 26.6–33.0)
MCHC: 33.4 g/dL (ref 31.5–35.7)
MCV: 82 fL (ref 79–97)
Monocytes Absolute: 0.2 10*3/uL (ref 0.1–0.9)
Monocytes: 5 %
Neutrophils Absolute: 2.3 10*3/uL (ref 1.4–7.0)
Neutrophils: 46 %
Platelets: 315 10*3/uL (ref 150–379)
RBC: 5.12 x10E6/uL (ref 4.14–5.80)
RDW: 14.6 % (ref 12.3–15.4)
WBC: 4.8 10*3/uL (ref 3.4–10.8)

## 2016-10-17 LAB — COMPREHENSIVE METABOLIC PANEL
ALT: 54 IU/L — ABNORMAL HIGH (ref 0–44)
AST: 69 IU/L — ABNORMAL HIGH (ref 0–40)
Albumin/Globulin Ratio: 1.6 (ref 1.2–2.2)
Albumin: 4.3 g/dL (ref 3.5–5.5)
Alkaline Phosphatase: 45 IU/L (ref 39–117)
BUN/Creatinine Ratio: 13 (ref 9–20)
BUN: 16 mg/dL (ref 6–24)
Bilirubin Total: 0.4 mg/dL (ref 0.0–1.2)
CO2: 23 mmol/L (ref 20–29)
Calcium: 9.7 mg/dL (ref 8.7–10.2)
Chloride: 100 mmol/L (ref 96–106)
Creatinine, Ser: 1.23 mg/dL (ref 0.76–1.27)
GFR calc Af Amer: 79 mL/min/{1.73_m2} (ref >59)
GFR calc non Af Amer: 68 mL/min/{1.73_m2} (ref >59)
Globulin, Total: 2.7 g/dL (ref 1.5–4.5)
Glucose: 81 mg/dL (ref 65–99)
Potassium: 4.6 mmol/L (ref 3.5–5.2)
Sodium: 139 mmol/L (ref 134–144)
Total Protein: 7 g/dL (ref 6.0–8.5)

## 2016-10-17 LAB — LIPID PANEL
Chol/HDL Ratio: 3.2 ratio (ref 0.0–5.0)
Cholesterol, Total: 144 mg/dL (ref 100–199)
HDL: 45 mg/dL (ref >39)
LDL Calculated: 83 mg/dL (ref 0–99)
Triglycerides: 79 mg/dL (ref 0–149)
VLDL Cholesterol Cal: 16 mg/dL (ref 5–40)

## 2016-10-17 LAB — HIV ANTIBODY (ROUTINE TESTING W REFLEX): HIV Screen 4th Generation wRfx: NONREACTIVE

## 2016-10-17 LAB — HEMOGLOBIN A1C
Est. average glucose Bld gHb Est-mCnc: 114 mg/dL
Hgb A1c MFr Bld: 5.6 % (ref 4.8–5.6)

## 2016-10-17 LAB — TSH: TSH: 3.19 u[IU]/mL (ref 0.450–4.500)

## 2016-10-19 ENCOUNTER — Encounter: Payer: Self-pay | Admitting: Emergency Medicine

## 2017-07-13 ENCOUNTER — Ambulatory Visit (INDEPENDENT_AMBULATORY_CARE_PROVIDER_SITE_OTHER): Payer: No Typology Code available for payment source | Admitting: Physician Assistant

## 2017-07-13 ENCOUNTER — Encounter: Payer: Self-pay | Admitting: Physician Assistant

## 2017-07-13 VITALS — BP 131/79 | HR 81 | Ht 68.0 in | Wt 203.0 lb

## 2017-07-13 DIAGNOSIS — Z6830 Body mass index (BMI) 30.0-30.9, adult: Secondary | ICD-10-CM

## 2017-07-13 DIAGNOSIS — E6609 Other obesity due to excess calories: Secondary | ICD-10-CM | POA: Diagnosis not present

## 2017-07-13 DIAGNOSIS — Z23 Encounter for immunization: Secondary | ICD-10-CM

## 2017-07-13 DIAGNOSIS — M170 Bilateral primary osteoarthritis of knee: Secondary | ICD-10-CM

## 2017-07-13 DIAGNOSIS — I10 Essential (primary) hypertension: Secondary | ICD-10-CM

## 2017-07-13 DIAGNOSIS — R7303 Prediabetes: Secondary | ICD-10-CM | POA: Diagnosis not present

## 2017-07-13 DIAGNOSIS — Z7689 Persons encountering health services in other specified circumstances: Secondary | ICD-10-CM

## 2017-07-13 DIAGNOSIS — R030 Elevated blood-pressure reading, without diagnosis of hypertension: Secondary | ICD-10-CM | POA: Insufficient documentation

## 2017-07-13 DIAGNOSIS — Z0000 Encounter for general adult medical examination without abnormal findings: Secondary | ICD-10-CM

## 2017-07-13 DIAGNOSIS — E66811 Other obesity due to excess calories: Secondary | ICD-10-CM

## 2017-07-13 NOTE — Progress Notes (Signed)
HPI:                                                                [name] is a 52 y.o. male who presents to [facility]: [facility] today establish care  Followed by the [facility] for his primary care. Establishing care today for insurance premium purposes and requesting annual physical exam  Depression screen [name] 2/9 07/13/2017 07/13/2017 10/16/2016  Decreased Interest 0 0 0  Down, Depressed, Hopeless 0 0 0  PHQ - 2 Score 0 0 0    No flowsheet data found.    Past Medical History:  Diagnosis Date  . Anemia    child  . Arthritis   . History of anemia 12/13/2012   Past Surgical History:  Procedure Laterality Date  . BICEPT TENODESIS Right 07/12/2015   Procedure: RIGHT SUBPECTORAL TENODESIS;  Surgeon: Netta Cedars, MD;  Location: [facility];  Service: Orthopedics;  Laterality: Right;  . ELBOW SURGERY Right 2014   tendon  . HAND SURGERY Right 10/2011   tendon rupture  . SHOULDER ARTHROSCOPY WITH SUBACROMIAL DECOMPRESSION Right 07/12/2015   Procedure: RIGHT SHOULDER ARTHROSCOPY WITH LABRAL DEBRIDEMENT LIMITED SUBACROMIAL DECOMPRESSION;  Surgeon: Netta Cedars, MD;  Location: [facility];  Service: Orthopedics;  Laterality: Right;  ANESTHESIA:  CHOICE WITH INTERSCALENE BLOCK  . VASECTOMY     Social History   Tobacco Use  . Smoking status: Former Smoker    Packs/day: 0.25    Years: 3.00    Pack years: 0.75    Types: Cigarettes    Last attempt to quit: 07/10/2006    Years since quitting: 11.0  . Smokeless tobacco: Never Used  Substance Use Topics  . Alcohol use: Not Currently   family history includes Alcohol abuse in his father; Alcoholism in his sister; Asthma in his sister; Colon polyps in his maternal grandmother; Throat cancer in his father.    ROS: negative except as noted in the HPI  Medications: No current outpatient medications on file.   No current facility-administered medications for this visit.    No Known  Allergies     Objective:  BP 131/79   Pulse 81   Ht _0  (1.727 m)   Wt 203 lb (92.1 kg)   BMI 30.87 kg/m  General Appearance:  Alert, cooperative, no distress, appropriate for age                            Head:  Normocephalic, without obvious abnormality                             Eyes:  PERRL, EOM's intact, conjunctiva and cornea clear, wearing glasses                             Ears:  TM pearly gray color and semitransparent, external ear canals normal, both ears                            Nose:  Nares symmetrical  Throat:  Lips, tongue, and mucosa are moist, pink, and intact; good dentition                             Neck:  Supple; symmetrical, trachea midline, no adenopathy; thyroid: no enlargement, symmetric, no tenderness/mass/nodules                             Back:  Symmetrical, no curvature, ROM normal                                      Lungs:  Clear to auscultation bilaterally, respirations unlabored                             Heart:  regular rate & normal rhythm, S1 and S2 normal, no murmurs, rubs, or gallops                     Abdomen:  Soft, non-tender, no mass or organomegaly              Genitourinary:  deferred         Musculoskeletal:  Tone and strength strong and symmetrical, all extremities; no joint pain or edema, normal gait and station                                   Lymphatic:  No adenopathy             Skin/Hair/Nails:  Skin warm, dry and intact, no rashes or abnormal dyspigmentation on limited exam                   Neurologic:  Alert and oriented x3, no cranial nerve deficits, sensation grossly intact, normal gait and station, no tremor Psych: well-groomed, cooperative, good eye contact, euthymic mood, affect mood-congruent, speech is articulate, and thought processes clear and goal-directed  Fasting Labs 05/13/17 Lipids TC 160 TG 66 HDL 59 LDL 88  PSA 1.1  Hgb A1C 5.8  CMP Na 140 K 4.2 Cl 105 CO2 30 Ca  8.7 BUN 10 Scr 1.39 GFR >60 Glucose 77 Albumin 3.9 Protain 7.5 Alk Phos 55 ALT 40 AST 30 Tbili 0.3 Direct bili <0.1  TSH 2.68  No results found for this or any previous visit (from the past 72 hour(s)). No results found.    Assessment and Plan: 52 y.o. male with   - Personally reviewed PMH, PSH, PFH, medications, allergies, HM - Age-appropriate cancer screening: Cologuard UTD per patient, requesting records from [name] - Influenza n/a - Tdap given today - PHQ2 negative  Prediabetes / Class 1 Obesity - counseled on weight loss through decreased caloric intake and increased aerobic exercise - low-carb ADA diet  HTN BP Readings from Last 3 Encounters:  07/13/17 131/79  10/16/16 (!) 146/78  04/14/16 127/72  - currently managed with lifestyle - CVD risk factors: male sex, obesity - normal lipids, no diabetes, no tobacco use, no family hx - counseled on therapeutic lifestyle changes   Patient education and anticipatory guidance given Patient agrees with treatment plan Follow-up as needed if symptoms worsen or fail to improve  Darlyne Russian PA-C

## 2017-07-13 NOTE — Patient Instructions (Signed)
Physical Activity Recommendations for modifying lipids and lowering blood pressure Engage in aerobic physical activity to reduce LDL-cholesterol, non-HDL-cholesterol, and blood pressure  Frequency: 3-4 sessions per week  Intensity: moderate to vigorous  Duration: 40 minutes on average  Physical Activity Recommendations for secondary prevention 1. Aerobic exercise  Frequency: 3-5 sessions per week  Intensity: 50-80% capacity  Duration: 20 - 60 minutes  Examples: walking, treadmill, cycling, rowing, stair climbing, and arm/leg ergometry  2. Resistance exercise  Frequency: 2-3 sessions per week  Intensity: 10-15 repetitions/set to moderate fatigue  Duration: 1-3 sets of 8-10 upper and lower body exercises  Examples: calisthenics, elastic bands, cuff/hand weights, dumbbels, free weights, wall pulleys, and weight machines  Heart-Healthy Lifestyle  Eating a diet rich in vegetables, fruits and whole grains: also includes low-fat dairy products, poultry, fish, legumes, and nuts; limit intake of sweets, sugar-sweetened beverages and red meats  Getting regular exercise  Maintaining a healthy weight  Not smoking or getting help quitting  Staying on top of your health; for some people, lifestyle changes alone may not be enough to prevent a heart attack or stroke. In these cases, taking a statin at the right dose will most likely be necessary   Preventive Care 40-64 Years, Male Preventive care refers to lifestyle choices and visits with your health care provider that can promote health and wellness. What does preventive care include?  A yearly physical exam. This is also called an annual well check.  Dental exams once or twice a year.  Routine eye exams. Ask your health care provider how often you should have your eyes checked.  Personal lifestyle choices, including: ? Daily care of your teeth and gums. ? Regular physical activity. ? Eating a healthy diet. ? Avoiding  tobacco and drug use. ? Limiting alcohol use. ? Practicing safe sex. ? Taking low-dose aspirin every day starting at age 50. What happens during an annual well check? The services and screenings done by your health care provider during your annual well check will depend on your age, overall health, lifestyle risk factors, and family history of disease. Counseling Your health care provider may ask you questions about your:  Alcohol use.  Tobacco use.  Drug use.  Emotional well-being.  Home and relationship well-being.  Sexual activity.  Eating habits.  Work and work environment.  Screening You may have the following tests or measurements:  Height, weight, and BMI.  Blood pressure.  Lipid and cholesterol levels. These may be checked every 5 years, or more frequently if you are over 50 years old.  Skin check.  Lung cancer screening. You may have this screening every year starting at age 55 if you have a 30-pack-year history of smoking and currently smoke or have quit within the past 15 years.  Fecal occult blood test (FOBT) of the stool. You may have this test every year starting at age 50.  Flexible sigmoidoscopy or colonoscopy. You may have a sigmoidoscopy every 5 years or a colonoscopy every 10 years starting at age 50.  Prostate cancer screening. Recommendations will vary depending on your family history and other risks.  Hepatitis C blood test.  Hepatitis B blood test.  Sexually transmitted disease (STD) testing.  Diabetes screening. This is done by checking your blood sugar (glucose) after you have not eaten for a while (fasting). You may have this done every 1-3 years.  Discuss your test results, treatment options, and if necessary, the need for more tests with your health care provider. Vaccines   Your health care provider may recommend certain vaccines, such as:  Influenza vaccine. This is recommended every year.  Tetanus, diphtheria, and acellular  pertussis (Tdap, Td) vaccine. You may need a Td booster every 10 years.  Varicella vaccine. You may need this if you have not been vaccinated.  Zoster vaccine. You may need this after age 60.  Measles, mumps, and rubella (MMR) vaccine. You may need at least one dose of MMR if you were born in 1957 or later. You may also need a second dose.  Pneumococcal 13-valent conjugate (PCV13) vaccine. You may need this if you have certain conditions and have not been vaccinated.  Pneumococcal polysaccharide (PPSV23) vaccine. You may need one or two doses if you smoke cigarettes or if you have certain conditions.  Meningococcal vaccine. You may need this if you have certain conditions.  Hepatitis A vaccine. You may need this if you have certain conditions or if you travel or work in places where you may be exposed to hepatitis A.  Hepatitis B vaccine. You may need this if you have certain conditions or if you travel or work in places where you may be exposed to hepatitis B.  Haemophilus influenzae type b (Hib) vaccine. You may need this if you have certain risk factors.  Talk to your health care provider about which screenings and vaccines you need and how often you need them. This information is not intended to replace advice given to you by your health care provider. Make sure you discuss any questions you have with your health care provider. Document Released: 03/08/2015 Document Revised: 10/30/2015 Document Reviewed: 12/11/2014 Elsevier Interactive Patient Education  2018 Elsevier Inc.  

## 2018-02-21 IMAGING — CT CT HEAD W/O CM
3 series · 14 of 47 positions shown, 16 images · non-contrast
Comparison: None.

CLINICAL DATA: Syncopal episode

EXAM:
CT HEAD WITHOUT CONTRAST
TECHNIQUE: Contiguous axial images were obtained from the base of the skull
through the vertex without intravenous contrast.

[Series 2: head wo · axial · 0.47mm/px · z∈[-183,-53]mm · 8 of 32 slices shown, 10 images]
[im 3/32  brain]
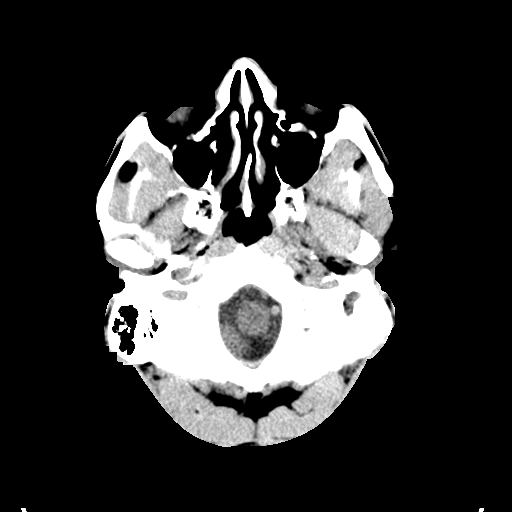
[im 3/32  bone]
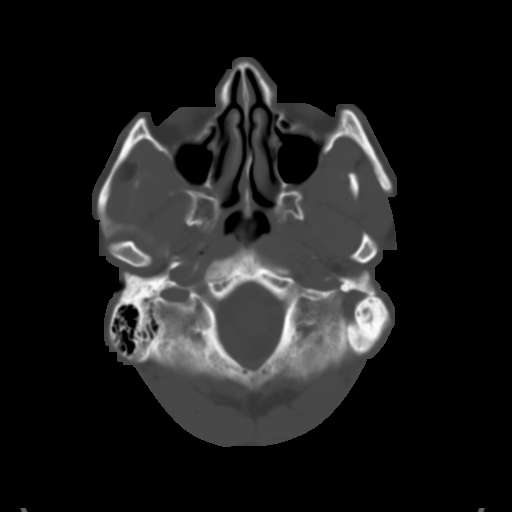
[im 7/32  brain]
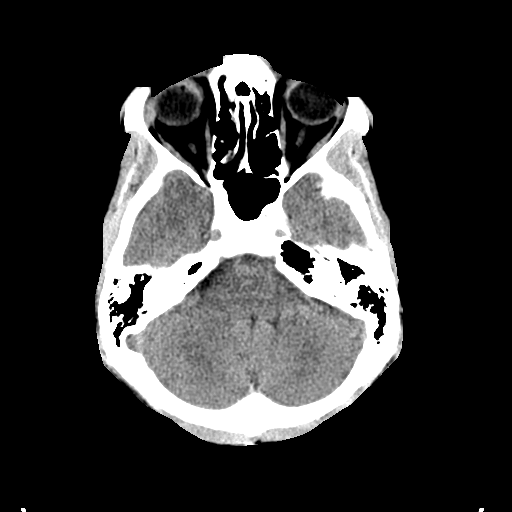
[im 10/32  brain]
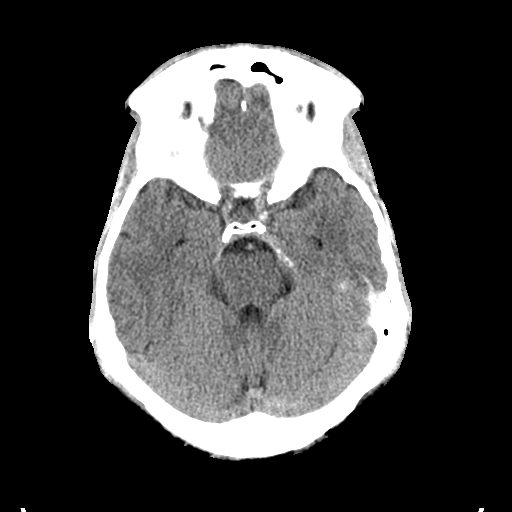
[im 14/32  brain]
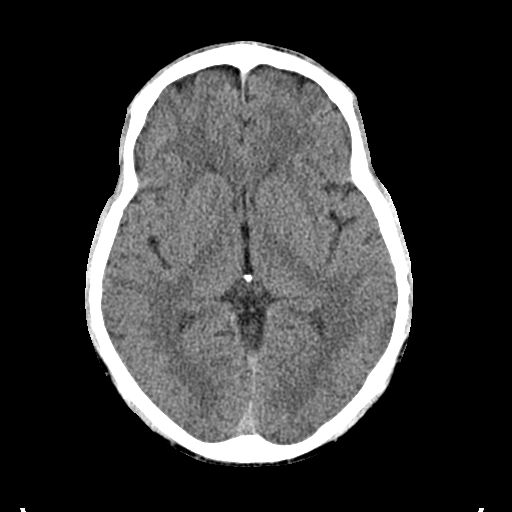
[im 18/32  brain]
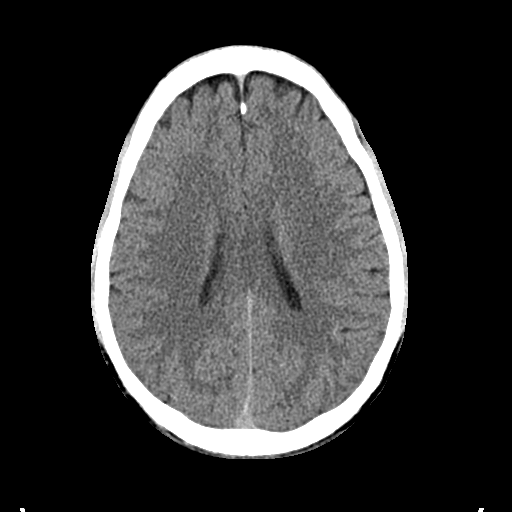
[im 18/32  bone]
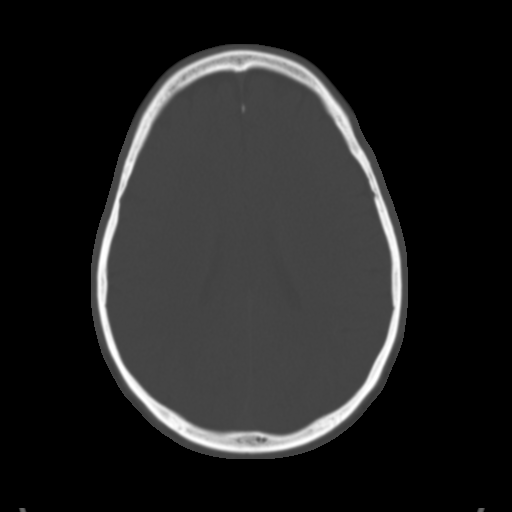
[im 22/32  brain]
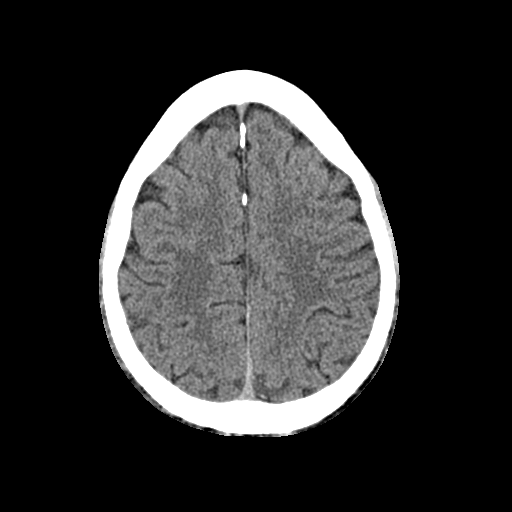
[im 25/32  brain]
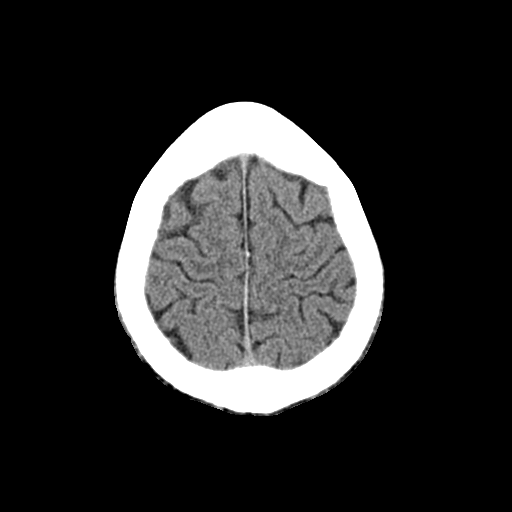
[im 29/32  brain]
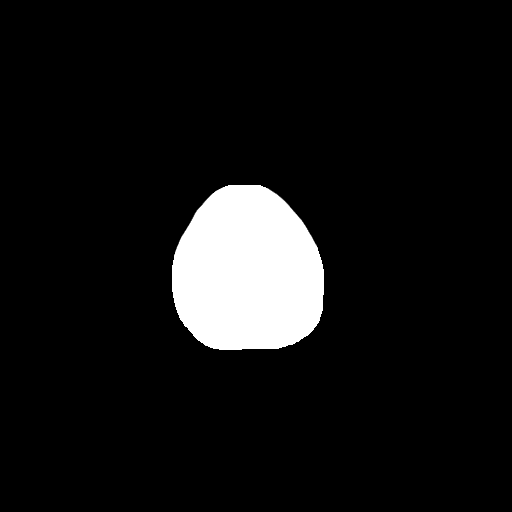

[Series 4: cor soft · coronal · 0.32mm/px · 3 of 71 slices shown]
[im 24/71  brain]
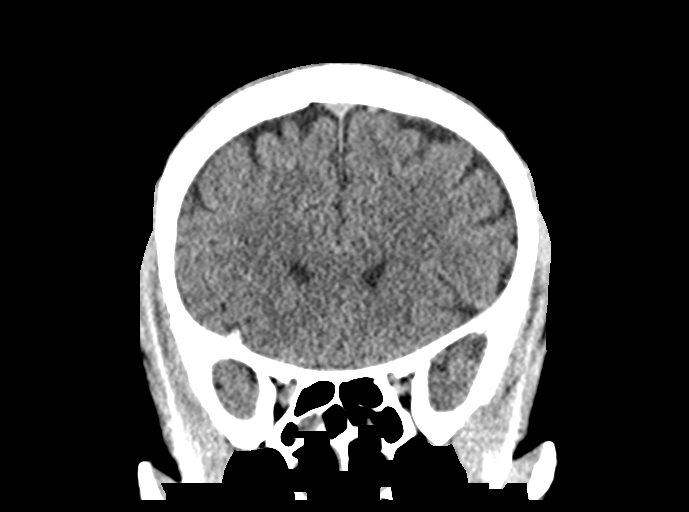
[im 32/71  brain]
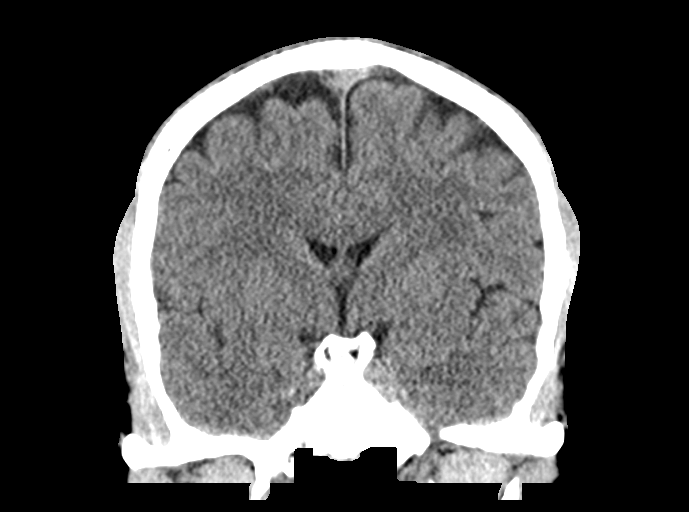
[im 39/71  brain]
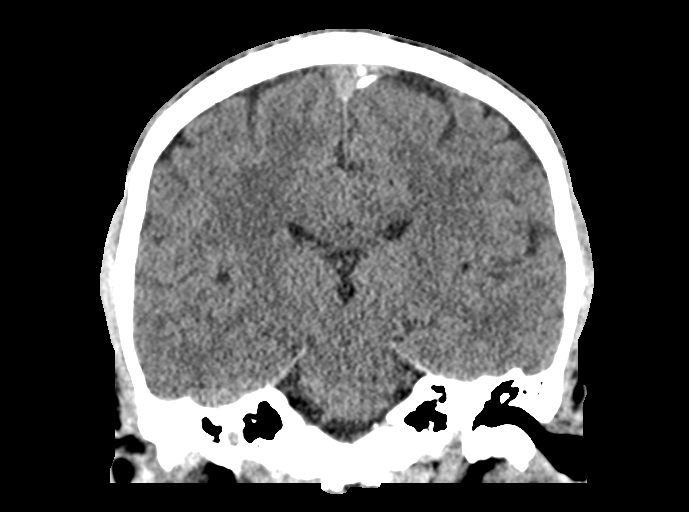

[Series 5: sag soft · sagittal · 0.32mm/px · 3 of 58 slices shown]
[im 20/58  brain]
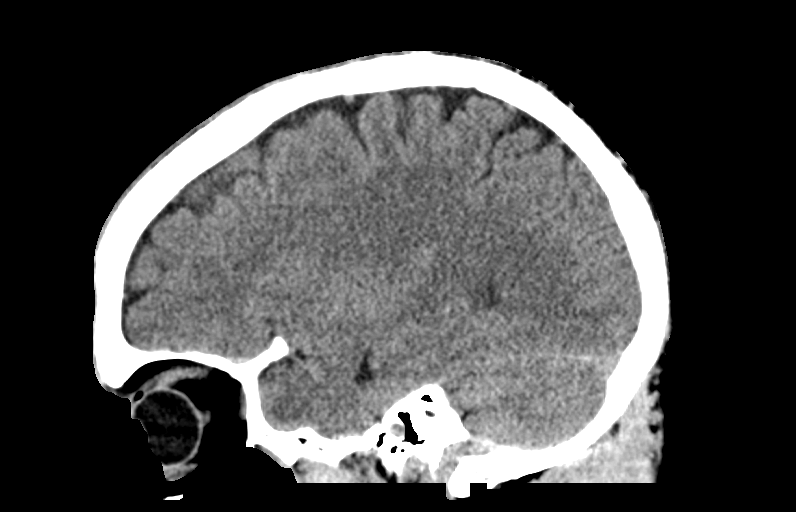
[im 29/58  brain]
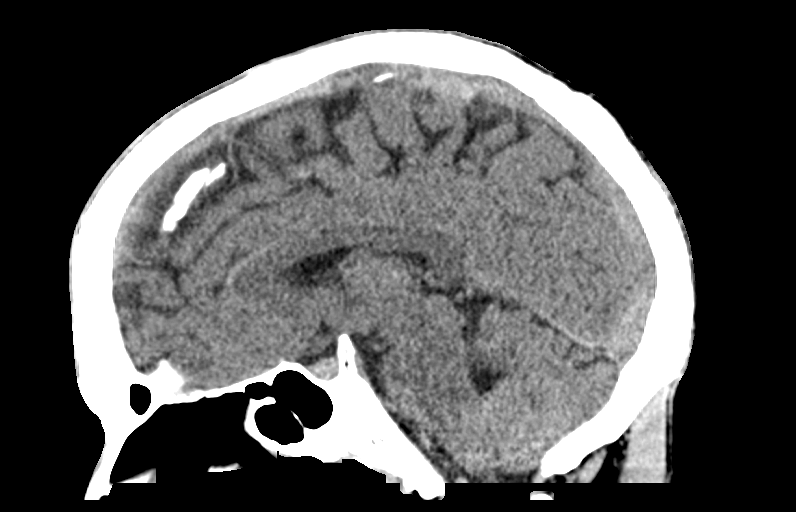
[im 39/58  brain]
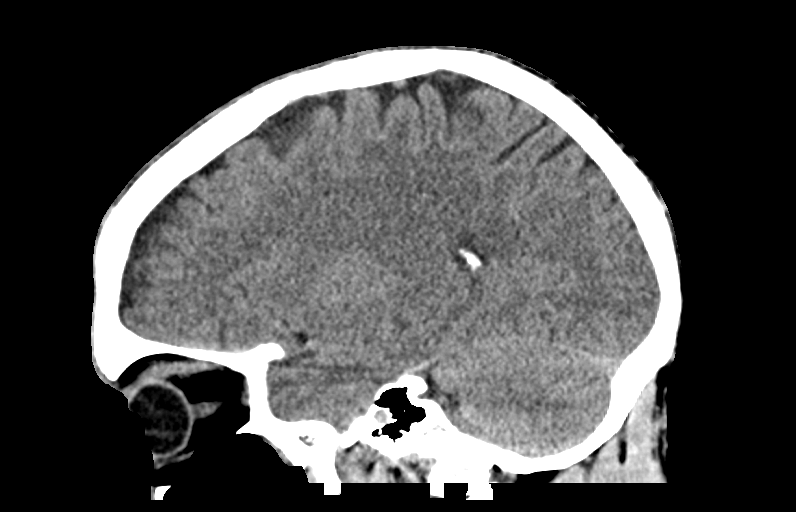

[14 of 47 positions shown; findings below may reference images not displayed]

FINDINGS: Brain: No evidence of acute infarction, hemorrhage, hydrocephalus,
extra-axial collection or mass lesion/mass effect.

Vascular: No hyperdense vessel or unexpected calcification.

Skull: Normal. Negative for fracture or focal lesion.

Sinuses/Orbits: Mucosal thickening within the ethmoid sinuses. No
acute orbital abnormality.

Other: None
IMPRESSION: No CT evidence for acute intracranial abnormality.

## 2018-03-16 DIAGNOSIS — F4310 Post-traumatic stress disorder, unspecified: Secondary | ICD-10-CM | POA: Diagnosis not present

## 2018-03-16 DIAGNOSIS — F4001 Agoraphobia with panic disorder: Secondary | ICD-10-CM | POA: Diagnosis not present

## 2018-03-16 DIAGNOSIS — F330 Major depressive disorder, recurrent, mild: Secondary | ICD-10-CM | POA: Diagnosis not present

## 2018-03-16 DIAGNOSIS — F411 Generalized anxiety disorder: Secondary | ICD-10-CM | POA: Diagnosis not present

## 2018-04-18 DIAGNOSIS — F411 Generalized anxiety disorder: Secondary | ICD-10-CM | POA: Diagnosis not present

## 2018-04-18 DIAGNOSIS — F4001 Agoraphobia with panic disorder: Secondary | ICD-10-CM | POA: Diagnosis not present

## 2018-04-18 DIAGNOSIS — F330 Major depressive disorder, recurrent, mild: Secondary | ICD-10-CM | POA: Diagnosis not present

## 2018-04-18 DIAGNOSIS — F4310 Post-traumatic stress disorder, unspecified: Secondary | ICD-10-CM | POA: Diagnosis not present

## 2018-06-23 DIAGNOSIS — G4733 Obstructive sleep apnea (adult) (pediatric): Secondary | ICD-10-CM | POA: Diagnosis not present

## 2018-06-23 DIAGNOSIS — Z9989 Dependence on other enabling machines and devices: Secondary | ICD-10-CM | POA: Diagnosis not present

## 2018-06-30 DIAGNOSIS — G4733 Obstructive sleep apnea (adult) (pediatric): Secondary | ICD-10-CM | POA: Diagnosis not present

## 2018-07-14 ENCOUNTER — Encounter: Payer: Self-pay | Admitting: *Deleted

## 2018-07-14 ENCOUNTER — Ambulatory Visit (INDEPENDENT_AMBULATORY_CARE_PROVIDER_SITE_OTHER): Payer: 59 | Admitting: Physician Assistant

## 2018-07-14 ENCOUNTER — Encounter: Payer: No Typology Code available for payment source | Admitting: Physician Assistant

## 2018-07-14 VITALS — BP 131/84 | HR 89 | Temp 98.8°F | Ht 68.0 in | Wt 209.0 lb

## 2018-07-14 DIAGNOSIS — R21 Rash and other nonspecific skin eruption: Secondary | ICD-10-CM

## 2018-07-14 DIAGNOSIS — Z0000 Encounter for general adult medical examination without abnormal findings: Secondary | ICD-10-CM | POA: Diagnosis not present

## 2018-07-14 DIAGNOSIS — Z862 Personal history of diseases of the blood and blood-forming organs and certain disorders involving the immune mechanism: Secondary | ICD-10-CM

## 2018-07-14 DIAGNOSIS — R7303 Prediabetes: Secondary | ICD-10-CM | POA: Diagnosis not present

## 2018-07-14 DIAGNOSIS — I10 Essential (primary) hypertension: Secondary | ICD-10-CM | POA: Diagnosis not present

## 2018-07-14 DIAGNOSIS — Z13220 Encounter for screening for lipoid disorders: Secondary | ICD-10-CM

## 2018-07-14 DIAGNOSIS — L308 Other specified dermatitis: Secondary | ICD-10-CM | POA: Diagnosis not present

## 2018-07-14 NOTE — Progress Notes (Signed)
HPI:                                                                [name] is a 53 y.o. male who presents to [facility] [name]: Primary Care Sports Medicine today for annual physical exam  Current concerns:  Reports a rash of his gluteal fold for the last 4 months. He states the skin feels thick and has lost sensitivity. It is occasionally itchy. No tenderness or drainage. No personal hx of dermatitis or psoriasis.  Depression screen [facility] 2/9 07/14/2018 07/13/2017 07/13/2017 10/16/2016  Decreased Interest 0 0 0 0  Down, Depressed, Hopeless 0 0 0 0  PHQ - 2 Score 0 0 0 0    GAD 7 : Generalized Anxiety Score 07/14/2018  Nervous, Anxious, on Edge 0  Control/stop worrying 0  Worry too much - different things 0  Trouble relaxing 0  Restless 0  Easily annoyed or irritable 0  Afraid - awful might happen 0  Total GAD 7 Score 0  Anxiety Difficulty Not difficult at all      Past Medical History:  Diagnosis Date  . Anemia    child  . Arthritis   . History of anemia 12/13/2012   Past Surgical History:  Procedure Laterality Date  . BICEPT TENODESIS Right 07/12/2015   Procedure: RIGHT SUBPECTORAL TENODESIS;  Surgeon: Steve Low, MD;  Location: [facility] OR;  Service: Orthopedics;  Laterality: Right;  . ELBOW SURGERY Right 2014   tendon  . HAND SURGERY Right 10/2011   tendon rupture  . SHOULDER ARTHROSCOPY WITH SUBACROMIAL DECOMPRESSION Right 07/12/2015   Procedure: RIGHT SHOULDER ARTHROSCOPY WITH LABRAL DEBRIDEMENT LIMITED SUBACROMIAL DECOMPRESSION;  Surgeon: Steve Low, MD;  Location: [facility] OR;  Service: Orthopedics;  Laterality: Right;  ANESTHESIA:  CHOICE WITH INTERSCALENE BLOCK  . VASECTOMY     Social History   Tobacco Use  . Smoking status: Former Smoker    Packs/day: 0.25    Years: 3.00    Pack years: 0.75    Types: Cigarettes    Last attempt to quit: 07/10/2006    Years since quitting: 12.0  . Smokeless tobacco: Never Used  Substance Use Topics  . Alcohol  use: Not Currently   family history includes Alcohol abuse in his father; Alcoholism in his sister; Asthma in his sister; Colon polyps in his maternal grandmother; Throat cancer in his father.    ROS: negative except as noted in the HPI  Medications: No current outpatient medications on file.   No current facility-administered medications for this visit.    No Known Allergies     Objective:  BP 131/84 (BP Location: Right Arm, Patient Position: Sitting, Cuff Size: Large)   Pulse 89   Temp 98.8 F (37.1 C) (Oral)   Ht 5\' 8"  (1.727 m)   Wt 209 lb (94.8 kg)   BMI 31.78 kg/m  General Appearance:  Alert, cooperative, no distress, appropriate for age                            Head:  Normocephalic, without obvious abnormality  Eyes:  PERRL, EOM's intact, conjunctiva and cornea clear                             Ears:  TM pearly gray color and semitransparent, external ear canals normal, both ears                            Nose:  Nares symmetrical, mucosa pink                          Throat:  Lips, tongue, and mucosa are moist, pink, and intact; oropharynx clear, uvula midline; good dentition                             Neck:  Supple; symmetrical, trachea midline, no adenopathy; thyroid: no enlargement, symmetric, no tenderness/mass/nodules                             Back:  Symmetrical, no curvature, ROM normal                              Lungs:  Clear to auscultation bilaterally, respirations unlabored                             Heart:  normal rate & regular rhythm, S1 and S2 normal, no murmurs, rubs, or gallops                     Abdomen:  Soft, non-tender, no mass or organomegaly              Genitourinary:  deferred         Musculoskeletal:  Tone and strength strong and symmetrical, all extremities; no joint pain or edema, normal gait and station                                      Lymphatic:  No adenopathy             Skin/Hair/Nails:  Left  inner gluteal fold there is approx 2.5 cm area of lichenified patch with fine scale                   Neurologic:  Alert and oriented x3, no cranial nerve deficits, sensation grossly intact, normal gait and station, no tremor Psych: well-groomed, cooperative, good eye contact, euthymic mood, affect mood-congruent, speech is articulate, and thought processes clear and goal-directed  A chaperone was present for the skin exam, [name], CMA.  BP Readings from Last 3 Encounters:  07/14/18 131/84  07/13/17 131/79  10/16/16 (!) 146/78    Lab Results  Component Value Date   CREATININE 1.23 10/16/2016   BUN 16 10/16/2016   NA 139 10/16/2016   K 4.6 10/16/2016   CL 100 10/16/2016   CO2 23 10/16/2016   Lab Results  Component Value Date   ALT 54 (H) 10/16/2016   AST 69 (H) 10/16/2016   ALKPHOS 45 10/16/2016   BILITOT 0.4 10/16/2016   Lab Results  Component Value Date   PSA 1.10 04/24/2017     No results  found for this or any previous visit (from the past 72 hour(s)). No results found.    Assessment and Plan: 53 y.o. male with   .[name] was seen today for annual exam.  Diagnoses and all orders for this visit:  Encounter for annual physical exam -     CBC -     COMPLETE METABOLIC PANEL WITH GFR -     Lipid Panel w/reflex Direct LDL  History of anemia -     CBC  Prediabetes -     COMPLETE METABOLIC PANEL WITH GFR -     Hemoglobin A1c  Screening for lipid disorders -     Lipid Panel w/reflex Direct LDL  Hypertension goal BP (blood pressure) < 130/80 -     COMPLETE METABOLIC PANEL WITH GFR  Rash and nonspecific skin eruption -     Dermatology pathology   - Personally reviewed PMH, PSH, PFH, medications, allergies, HM - Age-appropriate cancer screening: Cologuard UTD, baseline PSA done 04/24/2017 - Tdap UTD - PHQ2 negative - Routine fasting labs pending     Patient education and anticipatory guidance given Patient agrees with treatment plan Follow-up  in 1 week for suture removal, 6 months for HTN/prediabetes or sooner as needed if symptoms worsen or fail to improve  Steve Hubertharley E. Cummings PA-C

## 2018-07-14 NOTE — Progress Notes (Signed)
Punch Biopsy Procedure Note  Pre-operative Diagnosis: rash of left gluteal fold, unspecified  Post-operative Diagnosis: same  Locations: left gluteal fold  Indications: pruritus, confirm diagnosis, patient preference  Anesthesia: 1 cc lidocaine 1% w/epi  Procedure Details   Patient informed of the risks (including bleeding and infection) and benefits of the  procedure and Verbal informed consent obtained.  The lesion and surrounding area was given a sterile prep using chlorhexidine and draped in the usual sterile fashion. The skin was then stretched perpendicular to the skin tension lines and the lesion removed using the 67mm punch. The resulting ellipse was then closed. The wound was closed with 1 x 0 Prolene using simple interrupted stitches. Antibiotic ointment and a sterile dressing applied. The specimen was sent for pathologic examination. The patient tolerated the procedure well.  Jed Limerick, RMA assisted with and chaperoned procedure    Condition: Stable  Complications: patien tolerated procedure well, 0 prolene was used because 3-0/4-0 was unavailable. suture material was a little too thick for this area and patient was advised to contact me if he is having any discomfort.  Plan: 1. Instructed to keep the wound dry and covered for 24-48h and clean thereafter. 2. Warning signs of infection were reviewed.   3. Recommended that the patient use OTC analgesics as needed for pain.  4. Return for suture removal in 7 days.   Levonne Hubert PA-C

## 2018-07-14 NOTE — Patient Instructions (Signed)
Skin Biopsy, Care After This sheet gives you information about how to care for yourself after your procedure. Your health care provider may also give you more specific instructions. If you have problems or questions, contact your health care provider. What can I expect after the procedure? After the procedure, it is common to have:  Soreness.  Bruising.  Itching. Follow these instructions at home: Biopsy site care Follow instructions from your health care provider about how to take care of your biopsy site. Make sure you:  Wash your hands with soap and water before and after you change your bandage (dressing). If soap and water are not available, use hand sanitizer.  Apply ointment on your biopsy site as directed by your health care provider.  Change your dressing as told by your health care provider.  Leave stitches (sutures), skin glue, or adhesive strips in place. These skin closures may need to stay in place for 2 weeks or longer. If adhesive strip edges start to loosen and curl up, you may trim the loose edges. Do not remove adhesive strips completely unless your health care provider tells you to do that.  If the biopsy area bleeds, apply gentle pressure for 10 minutes. Check your biopsy site every day for signs of infection. Check for:  Redness, swelling, or pain.  Fluid or blood.  Warmth.  Pus or a bad smell.  General instructions  Rest and then return to your normal activities as told by your health care provider.  Take over-the-counter and prescription medicines only as told by your health care provider.  Keep all follow-up visits as told by your health care provider. This is important. Contact a health care provider if:  You have redness, swelling, or pain around your biopsy site.  You have fluid or blood coming from your biopsy site.  Your biopsy site feels warm to the touch.  You have pus or a bad smell coming from your biopsy site.  You have a fever.   Your sutures, skin glue, or adhesive strips loosen or come off sooner than expected. Get help right away if:  You have bleeding that does not stop with pressure or a dressing. Summary  After the procedure, it is common to have soreness, bruising, and itching at the site.  Follow instructions from your health care provider about how to take care of your biopsy site.  Check your biopsy site every day for signs of infection.  Contact a health care provider if you have redness, swelling, or pain around your biopsy site, or your biopsy site feels warm to the touch.  Keep all follow-up visits as told by your health care provider. This is important. This information is not intended to replace advice given to you by your health care provider. Make sure you discuss any questions you have with your health care provider. Document Released: 03/08/2015 Document Revised: 08/09/2017 Document Reviewed: 08/09/2017 Elsevier Interactive Patient Education  2019 [place] Years, Male Preventive care refers to lifestyle choices and visits with your health care provider that can promote health and wellness. What does preventive care include?   A yearly physical exam. This is also called an annual well check.  Dental exams once or twice a year.  Routine eye exams. Ask your health care provider how often you should have your eyes checked.  Personal lifestyle choices, including: ? Daily care of your teeth and gums. ? Regular physical activity. ? Eating a healthy diet. ? Avoiding  tobacco and drug use. ? Limiting alcohol use. ? Practicing safe sex. ? Taking low-dose aspirin every day starting at age 25. What happens during an annual well check? The services and screenings done by your health care provider during your annual well check will depend on your age, overall health, lifestyle risk factors, and family history of disease. Counseling Your health care provider may ask  you questions about your:  Alcohol use.  Tobacco use.  Drug use.  Emotional well-being.  Home and relationship well-being.  Sexual activity.  Eating habits.  Work and work Statistician. Screening You may have the following tests or measurements:  Height, weight, and BMI.  Blood pressure.  Lipid and cholesterol levels. These may be checked every 5 years, or more frequently if you are over 84 years old.  Skin check.  Lung cancer screening. You may have this screening every year starting at age 46 if you have a 30-pack-year history of smoking and currently smoke or have quit within the past 15 years.  Colorectal cancer screening. All adults should have this screening starting at age 60 and continuing until age 41. Your health care provider may recommend screening at age 55. You will have tests every 1-10 years, depending on your results and the type of screening test. People at increased risk should start screening at an earlier age. Screening tests may include: ? Guaiac-based fecal occult blood testing. ? Fecal immunochemical test (FIT). ? Stool DNA test. ? Virtual colonoscopy. ? Sigmoidoscopy. During this test, a flexible tube with a tiny camera (sigmoidoscope) is used to examine your rectum and lower colon. The sigmoidoscope is inserted through your anus into your rectum and lower colon. ? Colonoscopy. During this test, a long, thin, flexible tube with a tiny camera (colonoscope) is used to examine your entire colon and rectum.  Prostate cancer screening. Recommendations will vary depending on your family history and other risks.  Hepatitis C blood test.  Hepatitis B blood test.  Sexually transmitted disease (STD) testing.  Diabetes screening. This is done by checking your blood sugar (glucose) after you have not eaten for a while (fasting). You may have this done every 1-3 years. Discuss your test results, treatment options, and if necessary, the need for more tests  with your health care provider. Vaccines Your health care provider may recommend certain vaccines, such as:  Influenza vaccine. This is recommended every year.  Tetanus, diphtheria, and acellular pertussis (Tdap, Td) vaccine. You may need a Td booster every 10 years.  Varicella vaccine. You may need this if you have not been vaccinated.  Zoster vaccine. You may need this after age 45.  Measles, mumps, and rubella (MMR) vaccine. You may need at least one dose of MMR if you were born in 1957 or later. You may also need a second dose.  Pneumococcal 13-valent conjugate (PCV13) vaccine. You may need this if you have certain conditions and have not been vaccinated.  Pneumococcal polysaccharide (PPSV23) vaccine. You may need one or two doses if you smoke cigarettes or if you have certain conditions.  Meningococcal vaccine. You may need this if you have certain conditions.  Hepatitis A vaccine. You may need this if you have certain conditions or if you travel or work in places where you may be exposed to hepatitis A.  Hepatitis B vaccine. You may need this if you have certain conditions or if you travel or work in places where you may be exposed to hepatitis B.  Haemophilus influenzae type  b (Hib) vaccine. You may need this if you have certain risk factors. Talk to your health care provider about which screenings and vaccines you need and how often you need them. This information is not intended to replace advice given to you by your health care provider. Make sure you discuss any questions you have with your health care provider. Document Released: 03/08/2015 Document Revised: 04/01/2017 Document Reviewed: 12/11/2014 Elsevier Interactive Patient Education  2019 [employer].

## 2018-07-15 LAB — COMPLETE METABOLIC PANEL WITH GFR
AG Ratio: 2.3 (calc) (ref 1.0–2.5)
ALT: 27 U/L (ref 9–46)
AST: 30 U/L (ref 10–35)
Albumin: 4.9 g/dL (ref 3.6–5.1)
Alkaline phosphatase (APISO): 36 U/L (ref 35–144)
BUN/Creatinine Ratio: 7 (calc) (ref 6–22)
BUN: 11 mg/dL (ref 7–25)
CO2: 29 mmol/L (ref 20–32)
Calcium: 9.5 mg/dL (ref 8.6–10.3)
Chloride: 106 mmol/L (ref 98–110)
Creat: 1.5 mg/dL — ABNORMAL HIGH (ref 0.70–1.33)
GFR, Est African American: 61 mL/min/{1.73_m2} (ref >=60)
GFR, Est Non African American: 53 mL/min/{1.73_m2} — ABNORMAL LOW (ref >=60)
Globulin: 2.1 g/dL (calc) (ref 1.9–3.7)
Glucose, Bld: 110 mg/dL — ABNORMAL HIGH (ref 65–99)
Potassium: 4.5 mmol/L (ref 3.5–5.3)
Sodium: 141 mmol/L (ref 135–146)
Total Bilirubin: 0.6 mg/dL (ref 0.2–1.2)
Total Protein: 7 g/dL (ref 6.1–8.1)

## 2018-07-15 LAB — LIPID PANEL W/REFLEX DIRECT LDL
Cholesterol: 178 mg/dL (ref <200)
HDL: 57 mg/dL (ref >=40)
LDL Cholesterol (Calc): 100 mg/dL (calc) — ABNORMAL HIGH
Non-HDL Cholesterol (Calc): 121 mg/dL (calc) (ref <130)
Total CHOL/HDL Ratio: 3.1 (calc) (ref <5.0)
Triglycerides: 117 mg/dL (ref <150)

## 2018-07-15 LAB — HEMOGLOBIN A1C
Hgb A1c MFr Bld: 5.7 % of total Hgb — ABNORMAL HIGH (ref <5.7)
Mean Plasma Glucose: 117 (calc)
eAG (mmol/L): 6.5 (calc)

## 2018-07-15 LAB — CBC
HCT: 43.9 % (ref 38.5–50.0)
Hemoglobin: 14.4 g/dL (ref 13.2–17.1)
MCH: 26.9 pg — ABNORMAL LOW (ref 27.0–33.0)
MCHC: 32.8 g/dL (ref 32.0–36.0)
MCV: 81.9 fL (ref 80.0–100.0)
MPV: 12.1 fL (ref 7.5–12.5)
Platelets: 254 10*3/uL (ref 140–400)
RBC: 5.36 10*6/uL (ref 4.20–5.80)
RDW: 16 % — ABNORMAL HIGH (ref 11.0–15.0)
WBC: 4.9 10*3/uL (ref 3.8–10.8)

## 2018-07-15 NOTE — Progress Notes (Signed)
Good morning, A1c is just in the prediabetic range. Kidney function is stable You do have mild chronic kidney disease, meaning that kidneys are still working and filtering but they are just not as efficient as they once were Mainstay of treatment for chronic kidney disease is good blood pressure control with blood pressure below 130/80, good glycemic control with hemoglobin A1c below 6.5, and limiting or avoiding medications that can be harmful to the kidneys including anti-inflammatories like Advil, Aleve, aspirin. I recommend we monitor your kidney function every 6 months Also recommend that you purchase a home blood pressure cuff and monitor your blood pressure around the same time each day at least a few days per week.  Follow-up with me if your blood pressure is consistently above 130/80.

## 2018-07-21 ENCOUNTER — Encounter: Payer: Self-pay | Admitting: Physician Assistant

## 2018-07-21 ENCOUNTER — Ambulatory Visit (INDEPENDENT_AMBULATORY_CARE_PROVIDER_SITE_OTHER): Payer: No Typology Code available for payment source | Admitting: Physician Assistant

## 2018-07-21 VITALS — BP 109/67 | HR 98 | Temp 98.7°F

## 2018-07-21 DIAGNOSIS — L209 Atopic dermatitis, unspecified: Secondary | ICD-10-CM

## 2018-07-21 DIAGNOSIS — L308 Other specified dermatitis: Secondary | ICD-10-CM

## 2018-07-21 DIAGNOSIS — Z4802 Encounter for removal of sutures: Secondary | ICD-10-CM

## 2018-07-21 MED ORDER — TRIAMCINOLONE ACETONIDE 0.5 % EX OINT: 1.0000 "application " | TOPICAL_OINTMENT | Freq: Two times a day (BID) | CUTANEOUS | 3 refills | Status: DC

## 2018-07-21 NOTE — Patient Instructions (Signed)

## 2018-07-21 NOTE — Progress Notes (Signed)
HPI:                                                                [name] is a 53 y.o. male who presents to [name]: Primary Care Sports Medicine today for suture removal / punch biopsy follow-up  Punch biopsy performed 1 week ago was benign showing psoriaform and spongiotic dermatitis.  Past Medical History:  Diagnosis Date  . Anemia    child  . Arthritis   . History of anemia 12/13/2012   Past Surgical History:  Procedure Laterality Date  . BICEPT TENODESIS Right 07/12/2015   Procedure: RIGHT SUBPECTORAL TENODESIS;  Surgeon: Beverely Low, MD;  Location: [facility] OR;  Service: Orthopedics;  Laterality: Right;  . ELBOW SURGERY Right 2014   tendon  . HAND SURGERY Right 10/2011   tendon rupture  . SHOULDER ARTHROSCOPY WITH SUBACROMIAL DECOMPRESSION Right 07/12/2015   Procedure: RIGHT SHOULDER ARTHROSCOPY WITH LABRAL DEBRIDEMENT LIMITED SUBACROMIAL DECOMPRESSION;  Surgeon: Beverely Low, MD;  Location: [facility] OR;  Service: Orthopedics;  Laterality: Right;  ANESTHESIA:  CHOICE WITH INTERSCALENE BLOCK  . VASECTOMY     Social History   Tobacco Use  . Smoking status: Former Smoker    Packs/day: 0.25    Years: 3.00    Pack years: 0.75    Types: Cigarettes    Last attempt to quit: 07/10/2006    Years since quitting: 12.0  . Smokeless tobacco: Never Used  Substance Use Topics  . Alcohol use: Not Currently   family history includes Alcohol abuse in his father; Alcoholism in his sister; Asthma in his sister; Colon polyps in his maternal grandmother; Throat cancer in his father.    ROS: negative except as noted in the HPI  Medications: No current outpatient medications on file.   No current facility-administered medications for this visit.    No Known Allergies     Objective:  BP 109/67   Pulse 98   Temp 98.7 F (37.1 C) (Oral)  Skin: single simple interrupted suture of inner gluteal fold of left buttock, no redness, drainage or induration, mild  dehiscence of the epidermal layer, left inner gluteal fold there is approx 2.5 cm area of lichenified patch with fine scale    No results found for this or any previous visit (from the past 72 hour(s)). No results found.    Assessment and Plan: 53 y.o. male with   .[name] was seen today for suture / staple removal.  Diagnoses and all orders for this visit:  Spongiotic dermatitis  Atopic dermatitis, unspecified type -     triamcinolone ointment (KENALOG) 0.5 %; Apply 1 application topically 2 (two) times daily. Cycle use. Do not use for more than 2 consecutive weeks on single area  Visit for suture removal  Reviewed biopsy result with patient Counseled on treatment for dermatitis to include moisturizing with emollients and cycling use of topical steroid    Patient education and anticipatory guidance given Patient agrees with treatment plan Follow-up as needed if symptoms worsen or fail to improve  Levonne Hubert PA-C

## 2018-07-21 NOTE — Progress Notes (Signed)
Subjective:    [name] is a 53 y.o. male who underwent punch biopsy on 07/14/18. He denies pain, redness, or drainage from the wound.     Objective:    BP 109/67   Pulse 98   Temp 98.7 F (37.1 C) (Oral)  Injury exam:  Site of punch biopsy is healing well, without evidence of infection.    Assessment:     Biopsy site is healing well, without evidence of infection.  There is mild dehiscence of the epidermal layer   Plan:     1. 1 suture were removed. Steri-strip placed 2. Wound care discussed. 3. Follow up as needed.

## 2018-08-01 DIAGNOSIS — I10 Essential (primary) hypertension: Secondary | ICD-10-CM | POA: Insufficient documentation

## 2018-08-01 LAB — PSA: PSA: 1.1

## 2018-09-14 DIAGNOSIS — F411 Generalized anxiety disorder: Secondary | ICD-10-CM | POA: Diagnosis not present

## 2018-09-14 DIAGNOSIS — F330 Major depressive disorder, recurrent, mild: Secondary | ICD-10-CM | POA: Diagnosis not present

## 2018-09-14 DIAGNOSIS — F4310 Post-traumatic stress disorder, unspecified: Secondary | ICD-10-CM | POA: Diagnosis not present

## 2018-09-14 DIAGNOSIS — F4001 Agoraphobia with panic disorder: Secondary | ICD-10-CM | POA: Diagnosis not present

## 2019-01-12 DIAGNOSIS — Z23 Encounter for immunization: Secondary | ICD-10-CM | POA: Diagnosis not present

## 2019-04-14 DIAGNOSIS — Z23 Encounter for immunization: Secondary | ICD-10-CM | POA: Diagnosis not present

## 2019-06-08 DIAGNOSIS — Z114 Encounter for screening for human immunodeficiency virus [HIV]: Secondary | ICD-10-CM | POA: Diagnosis not present

## 2019-06-08 DIAGNOSIS — Z0189 Encounter for other specified special examinations: Secondary | ICD-10-CM | POA: Diagnosis not present

## 2019-06-08 DIAGNOSIS — Z113 Encounter for screening for infections with a predominantly sexual mode of transmission: Secondary | ICD-10-CM | POA: Diagnosis not present

## 2019-07-06 DIAGNOSIS — G4733 Obstructive sleep apnea (adult) (pediatric): Secondary | ICD-10-CM | POA: Diagnosis not present

## 2019-07-27 DIAGNOSIS — Z0189 Encounter for other specified special examinations: Secondary | ICD-10-CM | POA: Diagnosis not present

## 2019-09-13 DIAGNOSIS — G4733 Obstructive sleep apnea (adult) (pediatric): Secondary | ICD-10-CM | POA: Diagnosis not present

## 2019-10-10 DIAGNOSIS — G4733 Obstructive sleep apnea (adult) (pediatric): Secondary | ICD-10-CM | POA: Diagnosis not present

## 2019-10-19 DIAGNOSIS — G4733 Obstructive sleep apnea (adult) (pediatric): Secondary | ICD-10-CM | POA: Diagnosis not present

## 2020-04-02 DIAGNOSIS — G4733 Obstructive sleep apnea (adult) (pediatric): Secondary | ICD-10-CM | POA: Diagnosis not present

## 2020-08-15 DIAGNOSIS — G4700 Insomnia, unspecified: Secondary | ICD-10-CM | POA: Diagnosis not present

## 2020-08-15 DIAGNOSIS — F32A Depression, unspecified: Secondary | ICD-10-CM | POA: Diagnosis not present

## 2020-08-15 DIAGNOSIS — G4730 Sleep apnea, unspecified: Secondary | ICD-10-CM | POA: Diagnosis not present

## 2020-09-19 DIAGNOSIS — Z1211 Encounter for screening for malignant neoplasm of colon: Secondary | ICD-10-CM | POA: Diagnosis not present

## 2020-09-19 LAB — FECAL OCCULT BLOOD, GUAIAC: Fecal Occult Blood: NEGATIVE

## 2020-10-23 ENCOUNTER — Encounter: Payer: Self-pay | Admitting: Medical-Surgical

## 2020-10-23 ENCOUNTER — Other Ambulatory Visit: Payer: Self-pay

## 2020-10-23 ENCOUNTER — Ambulatory Visit: Payer: 59 | Admitting: Medical-Surgical

## 2020-10-23 ENCOUNTER — Other Ambulatory Visit (HOSPITAL_COMMUNITY)
Admission: RE | Admit: 2020-10-23 | Discharge: 2020-10-23 | Disposition: A | Discharge status: Home / Self Care | Payer: 59 | Source: Ambulatory Visit | Attending: Medical-Surgical | Admitting: Medical-Surgical

## 2020-10-23 VITALS — BP 111/66 | HR 74 | Temp 99.1°F | Ht 68.0 in | Wt 181.9 lb

## 2020-10-23 DIAGNOSIS — Z7689 Persons encountering health services in other specified circumstances: Secondary | ICD-10-CM | POA: Diagnosis not present

## 2020-10-23 DIAGNOSIS — Z1159 Encounter for screening for other viral diseases: Secondary | ICD-10-CM

## 2020-10-23 DIAGNOSIS — Z23 Encounter for immunization: Secondary | ICD-10-CM

## 2020-10-23 DIAGNOSIS — L989 Disorder of the skin and subcutaneous tissue, unspecified: Secondary | ICD-10-CM

## 2020-10-23 DIAGNOSIS — Z0000 Encounter for general adult medical examination without abnormal findings: Secondary | ICD-10-CM

## 2020-10-23 DIAGNOSIS — L821 Other seborrheic keratosis: Secondary | ICD-10-CM | POA: Diagnosis not present

## 2020-10-23 DIAGNOSIS — Z1329 Encounter for screening for other suspected endocrine disorder: Secondary | ICD-10-CM

## 2020-10-23 NOTE — Patient Instructions (Signed)
Influenza (Flu) Vaccine (Inactivated or Recombinant): What You Need to Know 1. Why get vaccinated? Influenza vaccine can prevent influenza (flu). Flu is a contagious disease that spreads around the [place] every year, usually between October and May. Anyone can get the flu, but it is more dangerous for some people. Infants and young children, people 65 years and older, pregnant people, and people with certain health conditions or a weakened immune system are at greatest risk of flu complications. Pneumonia, bronchitis, sinus infections, and ear infections are examples of flu-related complications. If you have a medical condition, such as heart disease, cancer, or diabetes, flu can make it worse. Flu can cause fever and chills, sore throat, muscle aches, fatigue, cough, headache, and runny or stuffy nose. Some people may have vomiting and diarrhea, though this is more common in children than adults. In an average year, thousands of people in the [place] die from flu, and many more are hospitalized. Flu vaccine prevents millions of illnesses and flu-related visits to the doctor each year. 2. Influenza vaccines CDC recommends everyone 6 months and older get vaccinated every flu season. Children 6 months through 8 years of age may need 2 doses during a single flu season. Everyone else needs only 1 dose each flu season. It takes about 2 weeks for protection to develop after vaccination. There are many flu viruses, and they are always changing. Each year a new flu vaccine is made to protect against the influenza viruses believed to be likely to cause disease in the upcoming flu season. Even when the vaccine doesn't exactly match these viruses, it may still provide some protection. Influenza vaccine does not cause flu. Influenza vaccine may be given at the same time as other vaccines. 3. Talk with your health care provider Tell your vaccination provider if the person getting the vaccine: Has had  an allergic reaction after a previous dose of influenza vaccine, or has any severe, life-threatening allergies Has ever had Guillain-Barr Syndrome (also called "GBS") In some cases, your health care provider may decide to postpone influenza vaccination until a future visit. Influenza vaccine can be administered at any time during pregnancy. People who are or will be pregnant during influenza season should receive inactivated influenza vaccine. People with minor illnesses, such as a cold, may be vaccinated. People who are moderately or severely ill should usually wait until they recover before getting influenza vaccine. Your health care provider can give you more information. 4. Risks of a vaccine reaction Soreness, redness, and swelling where the shot is given, fever, muscle aches, and headache can happen after influenza vaccination. There may be a very small increased risk of Guillain-Barr Syndrome (GBS) after inactivated influenza vaccine (the flu shot). Young children who get the flu shot along with pneumococcal vaccine (PCV13) and/or DTaP vaccine at the same time might be slightly more likely to have a seizure caused by fever. Tell your health care provider if a child who is getting flu vaccine has ever had a seizure. People sometimes faint after medical procedures, including vaccination. Tell your provider if you feel dizzy or have vision changes or ringing in the ears. As with any medicine, there is a very remote chance of a vaccine causing a severe allergic reaction, other serious injury, or death. 5. What if there is a serious problem? An allergic reaction could occur after the vaccinated person leaves the clinic. If you see signs of a severe allergic reaction (hives, swelling of the face and throat, difficulty breathing,   a fast heartbeat, dizziness, or weakness), call 9-1-1 and get the person to the nearest hospital. For other signs that concern you, call your health care provider. Adverse  reactions should be reported to the Vaccine Adverse Event Reporting System (VAERS). Your health care provider will usually file this report, or you can do it yourself. Visit the VAERS website at www.vaers.hhs.gov or call 1-800-822-7967. VAERS is only for reporting reactions, and VAERS staff members do not give medical advice. 6. The National Vaccine Injury Compensation Program The National Vaccine Injury Compensation Program (VICP) is a federal program that was created to compensate people who may have been injured by certain vaccines. Claims regarding alleged injury or death due to vaccination have a time limit for filing, which may be as short as two years. Visit the VICP website at www.hrsa.gov/vaccinecompensation or call 1-800-338-2382 to learn about the program and about filing a claim. 7. How can I learn more? Ask your health care provider. Call your local or state health department. Visit the website of the Food and Drug Administration (FDA) for vaccine package inserts and additional information at www.fda.gov/vaccines-blood-biologics/vaccines. Contact the Centers for Disease Control and Prevention (CDC): Call 1-800-232-4636 (1-800-CDC-INFO) or Visit CDC's website at www.cdc.gov/flu. Vaccine Information Statement Inactivated Influenza Vaccine (09/29/2019) This information is not intended to replace advice given to you by your health care provider. Make sure you discuss any questions you have with your health care provider. Document Revised: 11/16/2019 Document Reviewed: 11/16/2019 Elsevier Patient Education  2022 [employer] Inc.  

## 2020-10-23 NOTE — Progress Notes (Signed)
  HPI with pertinent ROS:   CC: Establish care, skin mole  HPI: Very pleasant 55 year old male presenting today to establish care.  He previously saw a provider here in our office in 2020 but seeks the majority of his care at the [place].  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  No significant concerns today aside from a mole that he noted on his back.  A couple of years ago, he had a tattoo put on his upper back in the photo showed a very light spot on the right upper back.  Recently, he got a new tattoo in the new photo and now the spot has changed color and is very dark in nature.  He did not know it was there previously and has had no concerning symptoms such as itching, redness, swelling, or drainage.  I reviewed the past medical history, family history, social history, surgical history, and allergies today and no changes were needed.  Please see the problem list section below in epic for further details.   Physical exam:   General: Well Developed, well nourished, and in no acute distress.  Neuro: Alert and oriented x3.  HEENT: Normocephalic, atraumatic.  Skin: Warm and dry.  See clinical photo. Cardiac: Regular rate and rhythm.  Respiratory: Not using accessory muscles, speaking in full sentences.     Impression and Recommendations:    1. Encounter to establish care Reviewed available information and discussed care concerns with patient.   2. Need for influenza vaccination Flu vaccine given in office today. - Flu Vaccine QUAD 28mo+IM (Fluarix, Fluzone & Alfiuria Quad PF)  3. Skin lesion of back  After informed consent was obtained, using hexedine for cleansing and bupivacaine 0.5% for anesthetic, with sterile technique, shave excision and electrocautery was performed. Antibiotic dressing is applied, and wound care instructions provided.  Be alert for any signs of cutaneous infection. The procedure was well tolerated without complications. Follow up: the specimen is labeled  and sent to pathology for evaluation, the patient may return prn.  Return if symptoms worsen or fail to improve. ___________________________________________ Thayer Ohm, DNP, APRN, FNP-BC Primary Care and Sports Medicine [facility] [place]

## 2020-10-23 NOTE — Addendum Note (Signed)
Addended by: Thermon Leyland on: 10/23/2020 02:29 PM   Modules accepted: Orders

## 2020-10-29 LAB — SURGICAL PATHOLOGY

## 2021-02-06 ENCOUNTER — Other Ambulatory Visit (HOSPITAL_BASED_OUTPATIENT_CLINIC_OR_DEPARTMENT_OTHER): Payer: Self-pay

## 2021-02-06 ENCOUNTER — Ambulatory Visit: Payer: 59 | Attending: Internal Medicine

## 2021-02-06 DIAGNOSIS — Z23 Encounter for immunization: Secondary | ICD-10-CM

## 2021-02-06 MED ORDER — PFIZER COVID-19 VAC BIVALENT 30 MCG/0.3ML IM SUSP
INTRAMUSCULAR | 0 refills | Status: AC
  Filled 2021-02-06: qty 0.3, 1d supply, fill #0

## 2021-02-06 NOTE — Progress Notes (Signed)
° °  Covid-19 Vaccination Clinic  Name:  [name]    MRN: [id number] DOB: 07-14-1965  02/06/2021  [name] was observed post Covid-19 immunization for 15 minutes without incident. He was provided with Vaccine Information Sheet and instruction to access the V-Safe system.   [name] was instructed to call 911 with any severe reactions post vaccine: Difficulty breathing  Swelling of face and throat  A fast heartbeat  A bad rash all over body  Dizziness and weakness   Immunizations Administered     Name Date Dose VIS Date Route   Pfizer Covid-19 Vaccine Bivalent Booster 02/06/2021  9:49 AM 0.3 mL 10/23/2020 Intramuscular   Manufacturer: [employer], [employer]   Lot: [id number]   NDC: [id number]

## 2021-08-14 DIAGNOSIS — Z0189 Encounter for other specified special examinations: Secondary | ICD-10-CM | POA: Diagnosis not present

## 2021-08-19 DIAGNOSIS — G4700 Insomnia, unspecified: Secondary | ICD-10-CM | POA: Diagnosis not present

## 2021-08-19 DIAGNOSIS — F32A Depression, unspecified: Secondary | ICD-10-CM | POA: Diagnosis not present

## 2021-08-19 DIAGNOSIS — Z1211 Encounter for screening for malignant neoplasm of colon: Secondary | ICD-10-CM | POA: Diagnosis not present

## 2021-08-19 DIAGNOSIS — G4730 Sleep apnea, unspecified: Secondary | ICD-10-CM | POA: Diagnosis not present

## 2021-09-23 DIAGNOSIS — J342 Deviated nasal septum: Secondary | ICD-10-CM | POA: Diagnosis not present

## 2021-09-23 DIAGNOSIS — G4733 Obstructive sleep apnea (adult) (pediatric): Secondary | ICD-10-CM | POA: Diagnosis not present

## 2021-09-23 DIAGNOSIS — J329 Chronic sinusitis, unspecified: Secondary | ICD-10-CM | POA: Diagnosis not present

## 2021-10-06 DIAGNOSIS — J329 Chronic sinusitis, unspecified: Secondary | ICD-10-CM | POA: Diagnosis not present

## 2021-10-11 DIAGNOSIS — Z8709 Personal history of other diseases of the respiratory system: Secondary | ICD-10-CM | POA: Diagnosis not present

## 2021-10-11 DIAGNOSIS — Z0389 Encounter for observation for other suspected diseases and conditions ruled out: Secondary | ICD-10-CM | POA: Diagnosis not present

## 2021-12-24 DIAGNOSIS — Z8669 Personal history of other diseases of the nervous system and sense organs: Secondary | ICD-10-CM | POA: Diagnosis not present

## 2021-12-24 DIAGNOSIS — J329 Chronic sinusitis, unspecified: Secondary | ICD-10-CM | POA: Diagnosis not present

## 2022-08-18 DIAGNOSIS — F32A Depression, unspecified: Secondary | ICD-10-CM | POA: Diagnosis not present

## 2022-08-18 DIAGNOSIS — G4700 Insomnia, unspecified: Secondary | ICD-10-CM | POA: Diagnosis not present

## 2022-08-18 DIAGNOSIS — G4730 Sleep apnea, unspecified: Secondary | ICD-10-CM | POA: Diagnosis not present

## 2022-08-18 DIAGNOSIS — M25561 Pain in right knee: Secondary | ICD-10-CM | POA: Diagnosis not present

## 2022-09-04 DIAGNOSIS — Z1211 Encounter for screening for malignant neoplasm of colon: Secondary | ICD-10-CM | POA: Diagnosis not present
